# Patient Record
Sex: Male | Born: 1990 | Race: White | Hispanic: No | Marital: Married | Smoking: Current every day smoker
Health system: Southern US, Community
[De-identification: ages and names within clinical notes are randomized; demographics above are authoritative.]

## PROBLEM LIST (undated history)

## (undated) DIAGNOSIS — F329 Major depressive disorder, single episode, unspecified: Secondary | ICD-10-CM

## (undated) DIAGNOSIS — M1990 Unspecified osteoarthritis, unspecified site: Secondary | ICD-10-CM

## (undated) DIAGNOSIS — R7989 Other specified abnormal findings of blood chemistry: Secondary | ICD-10-CM

## (undated) DIAGNOSIS — F32A Depression, unspecified: Secondary | ICD-10-CM

## (undated) HISTORY — PX: OTHER SURGICAL HISTORY: SHX169

## (undated) HISTORY — PX: HYDROCELE EXCISION / REPAIR: SUR1145

---
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
## 2011-04-18 ENCOUNTER — Emergency Department: Payer: Self-pay | Admitting: Emergency Medicine

## 2011-04-18 LAB — CBC
HCT: 47.3 % (ref 40.0–52.0)
MCH: 31.1 pg (ref 26.0–34.0)
MCV: 89 fL (ref 80–100)
Platelet: 297 10*3/uL (ref 150–440)
RBC: 5.32 10*6/uL (ref 4.40–5.90)
WBC: 7.9 10*3/uL (ref 3.8–10.6)

## 2011-04-18 LAB — COMPREHENSIVE METABOLIC PANEL
Alkaline Phosphatase: 154 U/L (ref 98–317)
Anion Gap: 12 (ref 7–16)
BUN: 8 mg/dL (ref 7–18)
Bilirubin,Total: 1.4 mg/dL — ABNORMAL HIGH (ref 0.2–1.0)
Chloride: 106 mmol/L (ref 98–107)
Co2: 26 mmol/L (ref 21–32)
Creatinine: 0.69 mg/dL (ref 0.60–1.30)
EGFR (African American): >60
Glucose: 76 mg/dL (ref 65–99)
SGOT(AST): 29 U/L (ref 10–41)
SGPT (ALT): 28 U/L
Sodium: 144 mmol/L (ref 136–145)
Total Protein: 8 g/dL (ref 6.4–8.6)

## 2011-04-18 LAB — URINALYSIS, COMPLETE
Bacteria: NONE SEEN
Bilirubin,UR: NEGATIVE
Blood: NEGATIVE
Ph: 7 (ref 4.5–8.0)
RBC,UR: <1 /HPF (ref 0–5)
Squamous Epithelial: NONE SEEN

## 2011-04-18 LAB — DRUG SCREEN, URINE
Amphetamines, Ur Screen: POSITIVE (ref ?–1000)
Barbiturates, Ur Screen: NEGATIVE (ref ?–200)
Benzodiazepine, Ur Scrn: NEGATIVE (ref ?–200)
Cannabinoid 50 Ng, Ur ~~LOC~~: NEGATIVE (ref ?–50)
MDMA (Ecstasy)Ur Screen: NEGATIVE (ref ?–500)
Phencyclidine (PCP) Ur S: NEGATIVE (ref ?–25)

## 2011-07-10 ENCOUNTER — Emergency Department: Payer: Self-pay | Admitting: Emergency Medicine

## 2011-07-25 ENCOUNTER — Ambulatory Visit: Payer: Self-pay | Admitting: Rheumatology

## 2012-07-19 ENCOUNTER — Emergency Department: Payer: Self-pay | Admitting: Emergency Medicine

## 2012-07-19 LAB — CBC
HCT: 45.5 % (ref 40.0–52.0)
HGB: 16 g/dL (ref 13.0–18.0)
MCH: 30.6 pg (ref 26.0–34.0)
Platelet: 266 10*3/uL (ref 150–440)
RBC: 5.24 10*6/uL (ref 4.40–5.90)
WBC: 11.5 10*3/uL — ABNORMAL HIGH (ref 3.8–10.6)

## 2012-07-19 LAB — COMPREHENSIVE METABOLIC PANEL
BUN: 9 mg/dL (ref 7–18)
Bilirubin,Total: 1.2 mg/dL — ABNORMAL HIGH (ref 0.2–1.0)
Co2: 27 mmol/L (ref 21–32)
Glucose: 77 mg/dL (ref 65–99)
Osmolality: 275 (ref 275–301)
Potassium: 3.3 mmol/L — ABNORMAL LOW (ref 3.5–5.1)
SGOT(AST): 23 U/L (ref 15–37)
SGPT (ALT): 26 U/L (ref 12–78)
Sodium: 139 mmol/L (ref 136–145)
Total Protein: 7.5 g/dL (ref 6.4–8.2)

## 2012-08-18 ENCOUNTER — Emergency Department: Payer: Self-pay | Admitting: Internal Medicine

## 2014-11-03 ENCOUNTER — Emergency Department
Admission: EM | Admit: 2014-11-03 | Discharge: 2014-11-03 | Disposition: A | Discharge status: Home / Self Care | Payer: BLUE CROSS/BLUE SHIELD | Attending: Emergency Medicine | Admitting: Emergency Medicine

## 2014-11-03 ENCOUNTER — Encounter: Payer: Self-pay | Admitting: Emergency Medicine

## 2014-11-03 ENCOUNTER — Emergency Department: Payer: BLUE CROSS/BLUE SHIELD

## 2014-11-03 DIAGNOSIS — N508 Other specified disorders of male genital organs: Secondary | ICD-10-CM | POA: Insufficient documentation

## 2014-11-03 DIAGNOSIS — N50811 Right testicular pain: Secondary | ICD-10-CM

## 2014-11-03 DIAGNOSIS — N50819 Testicular pain, unspecified: Secondary | ICD-10-CM

## 2014-11-03 HISTORY — DX: Depression, unspecified: F32.A

## 2014-11-03 HISTORY — DX: Major depressive disorder, single episode, unspecified: F32.9

## 2014-11-03 HISTORY — DX: Other specified abnormal findings of blood chemistry: R79.89

## 2014-11-03 LAB — URINALYSIS COMPLETE WITH MICROSCOPIC (ARMC ONLY)
Bacteria, UA: NONE SEEN
Bilirubin Urine: NEGATIVE
Glucose, UA: NEGATIVE mg/dL
KETONES UR: NEGATIVE mg/dL
NITRITE: NEGATIVE
PROTEIN: NEGATIVE mg/dL
Specific Gravity, Urine: 1.021 (ref 1.005–1.030)
pH: 5 (ref 5.0–8.0)

## 2014-11-03 NOTE — ED Notes (Signed)
Patient to ER for c/o right sided testicular pain since yesterday. Was recently started on Androgen gel.

## 2014-11-03 NOTE — ED Provider Notes (Signed)
[facility] Emergency Department Provider Note  ____________________________________________  Time seen: Approximately 6:32 PM  I have reviewed the triage vital signs and the nursing notes.   HISTORY  Chief Complaint Testicle Pain    HPI Dakota Oliver is a 24 y.o. male patient reports his doctor had started him on some and her gel. He is having some burning pain in his right testicle. The shadow test was done here and was normal. This just started. Patient denies any discharge dysuria or any other complaints. Nothing seems to make the pain better or worse.   Past Medical History  Diagnosis Date  . Low testosterone   . Depression     There are no active problems to display for this patient.   Past Surgical History  Procedure Laterality Date  . Hydrocele excision / repair    . Shrapnel removal from chest      No current outpatient prescriptions on file.  Allergies Prochlorperazine  No family history on file.  Social History History  Substance Use Topics  . Smoking status: Never Smoker   . Smokeless tobacco: Not on file  . Alcohol Use: No    Review of Systems Constitutional: No fever/chills Eyes: No visual changes. ENT: No sore throat. Cardiovascular: Denies chest pain. Respiratory: Denies shortness of breath. Gastrointestinal: No abdominal pain.  No nausea, no vomiting.  No diarrhea.  No constipation. Genitourinary: Negative for dysuria. Musculoskeletal: Negative for back pain. Skin: Negative for rash. Neurological: Negative for headaches, focal weakness or numbness.  10-point ROS otherwise negative.  ____________________________________________   PHYSICAL EXAM:  VITAL SIGNS: ED Triage Vitals  Enc Vitals Group     BP 11/03/14 1529 120/58 mmHg     Pulse Rate 11/03/14 1529 86     Resp 11/03/14 1529 18     Temp 11/03/14 1529 98.4 F (36.9 C)     Temp Source 11/03/14 1529 Oral     SpO2 11/03/14 1529 97 %     Weight  11/03/14 1529 220 lb (99.791 kg)     Height 11/03/14 1529  (1.676 m)     Head Cir --      Peak Flow --      Pain Score 11/03/14 1530 4     Pain Loc --      Pain Edu? --      Excl. in GC? --     Constitutional: Alert and oriented. Well appearing and in no acute distress. Eyes: Conjunctivae are normal. PERRL. EOMI. Head: Atraumatic. Nose: No congestion/rhinnorhea. Mouth/Throat: Mucous membranes are moist.   Genitourinary: Normal circumcised male. Testicles are normal in size nontender to palpation and there are no nodes palpable in the scrotum. Spermatic cord feels normal. There is no hernia that I can find.  Musculoskeletal: No lower extremity tenderness nor edema.  No joint effusions. Neurologic:  Normal speech and language. No gross focal neurologic deficits are appreciated.  Skin:  Skin is warm, dry and intact. No rash noted. Psychiatric: Mood and affect are normal. Speech and behavior are normal.  ____________________________________________   LABS (all labs ordered are listed, but only abnormal results are displayed)  Labs Reviewed  URINALYSIS COMPLETEWITH MICROSCOPIC ([facility] ONLY)   ____________________________________________  EKG   ____________________________________________  RADIOLOGY  Ultrasound is reported as no torsion ____________________________________________   PROCEDURES    ____________________________________________   INITIAL IMPRESSION / ASSESSMENT AND PLAN / ED COURSE  Pertinent labs & imaging results that were available during my care of the patient  were reviewed by me and considered in my medical decision making (see chart for details).  Skills with Dr. Berneice Heinrich a urologist. He recommends stopping the AndroGel. No other treatment. ____________________________________________   FINAL CLINICAL IMPRESSION(S) / ED DIAGNOSES  Final diagnoses:  Pain in right testicle      Arnaldo Natal, MD 11/04/14 0040

## 2014-11-03 NOTE — Discharge Instructions (Signed)
Please follow-up with your doctor or the urologist. Dr. can refer you to the [facility] urologist of his choice. All just on call here tonight Dr. Urban Gibson recommends stopping AndroGel. He feels that will put it into the testicular pain. Please return for worse pain or any other problems. Urine did not show any sign of infection. The ultrasound was normal.

## 2016-05-14 ENCOUNTER — Ambulatory Visit: Payer: Self-pay | Admitting: Urology

## 2016-05-14 ENCOUNTER — Ambulatory Visit (INDEPENDENT_AMBULATORY_CARE_PROVIDER_SITE_OTHER): Payer: BLUE CROSS/BLUE SHIELD | Admitting: Urology

## 2016-05-14 ENCOUNTER — Encounter: Payer: Self-pay | Admitting: Urology

## 2016-05-14 VITALS — BP 123/82 | HR 51 | Ht 66.0 in | Wt 170.0 lb

## 2016-05-14 DIAGNOSIS — N432 Other hydrocele: Secondary | ICD-10-CM | POA: Diagnosis not present

## 2016-05-14 DIAGNOSIS — N50819 Testicular pain, unspecified: Secondary | ICD-10-CM | POA: Diagnosis not present

## 2016-05-14 DIAGNOSIS — N451 Epididymitis: Secondary | ICD-10-CM

## 2016-05-14 DIAGNOSIS — E291 Testicular hypofunction: Secondary | ICD-10-CM | POA: Diagnosis not present

## 2016-05-14 DIAGNOSIS — Z113 Encounter for screening for infections with a predominantly sexual mode of transmission: Secondary | ICD-10-CM | POA: Diagnosis not present

## 2016-05-14 MED ORDER — CEFTRIAXONE SODIUM 1 G IJ SOLR: 1.0000 g | INTRAMUSCULAR | Status: DC

## 2016-05-14 MED ORDER — DOXYCYCLINE HYCLATE 100 MG PO CAPS: 100.0000 mg | ORAL_CAPSULE | Freq: Two times a day (BID) | ORAL | 0 refills | Status: DC

## 2016-05-14 MED ORDER — CEFTRIAXONE SODIUM 1 G IJ SOLR
1.0000 g | Freq: Once | INTRAMUSCULAR | Status: AC
  Administered 2016-05-14: 1 g via INTRAMUSCULAR

## 2016-05-14 NOTE — Progress Notes (Signed)
IM Injection  Patient is present today for an IM Injection for treatment of epidiymis  Drug: Ceftriaxone Dose:1GM Location:Left upper outer buttocks Lot: [id number] Exp:01/29/2018 Patient tolerated well, no complications were noted  Preformed by: Eligha BridegroomSarah Nusrat Encarnacion, CMA

## 2016-05-14 NOTE — Progress Notes (Signed)
05/14/2016 3:12 PM   Dakota Oliver [birth year] [id number]  Referring provider: Sherron Monday, MD [address] [place], [place] [address]  Chief Complaint  Patient presents with  . Testicle Pain    New Patient    HPI: 26 yo M with who presents today for 2 weeks history right testicular pain and swelling. He notes that initially his right testicle and scrotum enlarged dramatically and is slowly improving although remains tender. He describes it as constant, dull, and achy. He has no associated nausea, or vomiting. No abdominal pain.  He notes that he was treated by his primary care physician with antibiotics but his symptoms have completely resolved.  He was given a 10 day course of Levaquin.    He denies any fevers or chills, urgency, frequency, or dysuria.  No recent scrotal imaging. He did have an episode of testicular pain in 2016 at which time he had a fairly unremarkable scrotal ultrasound.  He is exually active, recent history of chlamydia a few months ago, partner treated as treated as well.  At the time of diagnosis, he was having dysuria.  Denies any scrotal trauma.    S/p hernia hydrocele on the left at age 54.    Dakota Oliver reports today that he has been on testosterone injections for the past several years prescribed by his PCP for hypogonadism. He has been using this intermittently as he has difficulty affording this and says he has not been able to give himself injections for the past 3 months. He reports that when he was diagnosed, his testosterone was around 70 per report. His symptoms at that time included at mental health issues, low libido, and fatigue. He does want children in the future.  He does topically smoke marijuana but reports that he had been abstaining from this for quite some time before he was diagnosed with hypogonadism. We do not have any lab work today for review.     PMH: Past Medical History:  Diagnosis Date  . Depression   . Low  testosterone     Surgical History: Past Surgical History:  Procedure Laterality Date  . HYDROCELE EXCISION / REPAIR    . Shrapnel removal from chest      Home Medications:  Allergies as of 05/14/2016      Reactions   Prochlorperazine Other (See Comments)   Dystonic reaction      Medication List       Accurate as of 05/14/16 11:59 PM. Always use your most recent med list.          amphetamine-dextroamphetamine 20 MG 24 hr capsule Commonly known as:  ADDERALL XR Take 20 mg by mouth every morning.   doxycycline 100 MG capsule Commonly known as:  VIBRAMYCIN Take 1 capsule (100 mg total) by mouth every 12 (twelve) hours.   FLUoxetine 20 MG capsule Commonly known as:  PROZAC Take 20 mg by mouth every morning.   testosterone cypionate 200 MG/ML injection Commonly known as:  DEPOTESTOSTERONE CYPIONATE inject 1 milliliter intramuscularly EVERY WEEK       Allergies:  Allergies  Allergen Reactions  . Prochlorperazine Other (See Comments)    Dystonic reaction    Family History: Family History  Problem Relation Age of Onset  . Prostate cancer Neg Hx   . Kidney cancer Neg Hx   . Bladder Cancer Neg Hx     Social History:  reports that he has been smoking E-cigarettes and Cigarettes.  He has never used smokeless  tobacco. He reports that he does not drink alcohol. His drug history is not on file.  ROS: UROLOGY Frequent Urination?: No Hard to postpone urination?: No Burning/pain with urination?: No Get up at night to urinate?: No Leakage of urine?: No Urine stream starts and stops?: No Trouble starting stream?: No Do you have to strain to urinate?: No Blood in urine?: No Urinary tract infection?: No Sexually transmitted disease?: Yes Injury to kidneys or bladder?: No Painful intercourse?: Yes Weak stream?: No Erection problems?: No Penile pain?: No  Gastrointestinal Nausea?: Yes Vomiting?: No Indigestion/heartburn?: No Diarrhea?: Yes Constipation?:  Yes  Constitutional Fever: No Night sweats?: Yes Weight loss?: No Fatigue?: Yes  Skin Skin rash/lesions?: No Itching?: No  Eyes Blurred vision?: No Double vision?: No  Ears/Nose/Throat Sore throat?: No Sinus problems?: Yes  Hematologic/Lymphatic Swollen glands?: Yes Easy bruising?: No  Cardiovascular Leg swelling?: No Chest pain?: No  Respiratory Cough?: Yes Shortness of breath?: No  Endocrine Excessive thirst?: Yes  Musculoskeletal Back pain?: Yes Joint pain?: No  Neurological Headaches?: No Dizziness?: No  Psychologic Depression?: Yes Anxiety?: Yes  Physical Exam: BP 123/82   Pulse (!) 51   Ht 5\' 6"  (1.676 m)   Wt 170 lb (77.1 kg)   BMI 27.44 kg/m   Constitutional:  Alert and oriented, No acute distress. HEENT: [place] AT, moist mucus membranes.  Trachea midline, no masses. Cardiovascular: No clubbing, cyanosis, or edema. Respiratory: Normal respiratory effort, no increased work of breathing. GI: Abdomen is soft, nontender, nondistended, no abdominal masses GU: Right epididymal enlargement and tenderness with small surrounding hydrocele. No overlying skin changes. Left testicle unremarkable. No intratesticular lesions bilaterally.  Normal circumcised phallus with orthotopic meatus, no urethral discharge appreciated.  Normal male hair pattern. Skin: No rashes, bruises or suspicious lesions. Lymph: No cervical or inguinal adenopathy. Neurologic: Grossly intact, no focal deficits, moving all 4 extremities. Psychiatric: Normal mood and affect.  Laboratory Data: Lab Results  Component Value Date   WBC 11.5 (H) 07/19/2012   HGB 16.0 07/19/2012   HCT 45.5 07/19/2012   MCV 87 07/19/2012   PLT 266 07/19/2012    Lab Results  Component Value Date   CREATININE 0.74 07/19/2012    Urinalysis Results for orders placed or performed in visit on 05/14/16  GC/Chlamydia Probe Amp  Result Value Ref Range   Chlamydia trachomatis, NAA Negative Negative    Neisseria gonorrhoeae by PCR Negative Negative  Microscopic Examination  Result Value Ref Range   WBC, UA 0-5 0 - 5 /hpf   RBC, UA 0-2 0 - 2 /hpf   Epithelial Cells (non renal) None seen 0 - 10 /hpf   Mucus, UA Present (A) Not Estab.   Bacteria, UA Few None seen/Few  Urinalysis, Complete  Result Value Ref Range   Specific Gravity, UA 1.020 1.005 - 1.030   pH, UA 6.0 5.0 - 7.5   Color, UA Yellow Yellow   Appearance Ur Clear Clear   Leukocytes, UA Negative Negative   Protein, UA 1+ (A) Negative/Trace   Glucose, UA Negative Negative   Ketones, UA Negative Negative   RBC, UA Trace (A) Negative   Bilirubin, UA Negative Negative   Urobilinogen, Ur 0.2 0.2 - 1.0 mg/dL   Nitrite, UA Negative Negative   Microscopic Examination See below:     Pertinent Imaging: n/a  Assessment & Plan:    1. Right epididymitis Tender right epididymis on exam, per patient improving somewhat but not yet resolved As a described Levaquin, will administer IV  Rocephin and change to oral doxycycline given his history of recent chlamydial infection STA screening as below Supportive care and NSAIDs  Patient advised to return or call if he fails to improve within the next week - Urinalysis, Complete - Trichomonas vaginalis, RNA - GC/Chlamydia Probe Amp - Testosterone; Future - FSH/LH; Future - Prolactin; Future - Estradiol; Future - HIV antibody (with reflex) - cefTRIAXone (ROCEPHIN) injection 1 g; Inject 1 g into the muscle once.  2. Other hydrocele Reactive mild right hydrocele, will likely improve with treatment of his epididymitis  3. Routine screening for STI (sexually transmitted infection) Recent history of chlamydia Patient interested in GC/chlamydia and HIV screening No Trichomonas in urine today on microscopy  4. Hypogonadism in male Reported history of hypogonadism intermittently using testosterone therapy This is somewhat alarming given the patient's age and desire for biological  children in the future for which I expressed my concern Suspect possible alternative underlying etiology for reported hypogonadal state including use of marijuana, etc. I offered to manage the patient's hypogonadism and start fresh with a new workup I have encouraged him to continue to abstain from testosterone injections for at least another month at which time he will not taken any exotic this medication for at least 4 months we'll plan for a.m. testosterone, FSH, LH, prolactin, TSH, CBC, estridiol    Return in about 4 weeks (around 06/11/2016) for labs only (AM) then follow up with me 6 weeks.  Vanna ScotlandAshley Juandaniel Manfredo, MD  [facility] [address], [address] [place], [place] [address] [phone]   I spent 45 min with this patient of which greater than 50% was spent in counseling and coordination of care with the patient.

## 2016-05-15 LAB — URINALYSIS, COMPLETE
Bilirubin, UA: NEGATIVE
Glucose, UA: NEGATIVE
Ketones, UA: NEGATIVE
Leukocytes, UA: NEGATIVE
Nitrite, UA: NEGATIVE
Specific Gravity, UA: 1.02 (ref 1.005–1.030)
UUROB: 0.2 mg/dL (ref 0.2–1.0)
pH, UA: 6 (ref 5.0–7.5)

## 2016-05-15 LAB — MICROSCOPIC EXAMINATION: Epithelial Cells (non renal): NONE SEEN /hpf (ref 0–10)

## 2016-05-18 LAB — GC/CHLAMYDIA PROBE AMP
CHLAMYDIA, DNA PROBE: NEGATIVE
Neisseria gonorrhoeae by PCR: NEGATIVE

## 2016-05-19 ENCOUNTER — Telehealth: Payer: Self-pay

## 2016-05-19 LAB — TRICHOMONAS VAGINALIS, PROBE AMP: TRICH VAG BY NAA: NEGATIVE

## 2016-05-19 NOTE — Telephone Encounter (Signed)
Pt returned Chelsea's call and I read message from Dr. Apolinar JunesBrandon.

## 2016-05-19 NOTE — Telephone Encounter (Signed)
LMOM

## 2016-05-19 NOTE — Telephone Encounter (Signed)
-----   Message from Vanna ScotlandAshley Brandon, MD sent at 05/18/2016  1:56 PM EST ----- GC/ chlamydia negative.  We will get HIV with blood labs in 1 month.    Vanna ScotlandAshley Brandon, MD

## 2016-05-22 ENCOUNTER — Telehealth: Payer: Self-pay | Admitting: Urology

## 2016-05-22 NOTE — Telephone Encounter (Signed)
Would you call the patient and get clarification on his symptoms, is he having fevers and/or chills, nausea and/or vomiting, swelling of the testicle?

## 2016-05-22 NOTE — Telephone Encounter (Signed)
Pt saw Dr. Apolinar JunesBrandon last week. Please advise.

## 2016-05-22 NOTE — Telephone Encounter (Signed)
Pt called office stating he was seen about a week ago, was given medication, has finished all medication, pt states not getting any better, it's getting worse with a lot of pain. Please advise.

## 2016-05-22 NOTE — Telephone Encounter (Signed)
Spoke with pt in reference to s/s. Pt denied n/v,f/c. Pt did state that his right testicle is swollen.

## 2016-05-23 NOTE — Telephone Encounter (Signed)
LMOM

## 2016-05-23 NOTE — Telephone Encounter (Signed)
I looks like this has been a chronic issue as he had complaints of right testicular pain in 2016.  Scrotal ultrasound noted hydroceles and bilateral epididymal cysts.  I suggest he have an office visit, so he can be reassess.

## 2016-05-23 NOTE — Telephone Encounter (Signed)
Spoke with pt in reference to testicular pain and needing an OV. Pt voiced understanding. Pt was transferred to the front to make f/u appt.

## 2016-05-26 NOTE — Progress Notes (Deleted)
05/27/2016 9:54 PM   Cristal Deerhristopher Bettey CostaM Lepkowski November 21, 1990 [id number]  Referring provider: Sherron MondayS Ahmed Tejan-Sie, MD [address] [place], [place] [address]  No chief complaint on file.   HPI: 26 yo WM with a right testicular pain and swelling that has not improved with IM Rocephin and doxycycline.  Background history 26 yo M with who presents today for 2 weeks history right testicular pain and swelling. He notes that initially his right testicle and scrotum enlarged dramatically and is slowly improving although remains tender. He describes it as constant, dull, and achy. He has no associated nausea, or vomiting. No abdominal pain.  He notes that he was treated by his primary care physician with antibiotics but his symptoms have completely resolved.  He was given a 10 day course of Levaquin.  He denies any fevers or chills, urgency, frequency, or dysuria.  No recent scrotal imaging. He did have an episode of testicular pain in 2016 at which time he had a fairly unremarkable scrotal ultrasound.  He is exually active, recent history of chlamydia a few months ago, partner treated as treated as well.  At the time of diagnosis, he was having dysuria.  Denies any scrotal trauma.  S/p hernia hydrocele on the left at age 89.    Trichomonas and GC/chlamydia were negative.    Today, he is ***.     [name] reports today that he has been on testosterone injections for the past several years prescribed by his PCP for hypogonadism. He has been using this intermittently as he has difficulty affording this and says he has not been able to give himself injections for the past 3 months. He reports that when he was diagnosed, his testosterone was around 70 per report. His symptoms at that time included at mental health issues, low libido, and fatigue. He does want children in the future.  He does topically smoke marijuana but reports that he had been abstaining from this for quite some time before he was diagnosed with  hypogonadism. We do not have any lab work today for review.  He will be returning at a later date for lab work after he has abstained from testosterone therapy for one month.     PMH: Past Medical History:  Diagnosis Date  . Depression   . Low testosterone     Surgical History: Past Surgical History:  Procedure Laterality Date  . HYDROCELE EXCISION / REPAIR    . Shrapnel removal from chest      Home Medications:  Allergies as of 05/27/2016      Reactions   Prochlorperazine Other (See Comments)   Dystonic reaction      Medication List       Accurate as of 05/26/16  9:54 PM. Always use your most recent med list.          amphetamine-dextroamphetamine 20 MG 24 hr capsule Commonly known as:  ADDERALL XR Take 20 mg by mouth every morning.   doxycycline 100 MG capsule Commonly known as:  VIBRAMYCIN Take 1 capsule (100 mg total) by mouth every 12 (twelve) hours.   FLUoxetine 20 MG capsule Commonly known as:  PROZAC Take 20 mg by mouth every morning.   testosterone cypionate 200 MG/ML injection Commonly known as:  DEPOTESTOSTERONE CYPIONATE inject 1 milliliter intramuscularly EVERY WEEK       Allergies:  Allergies  Allergen Reactions  . Prochlorperazine Other (See Comments)    Dystonic reaction    Family History: Family History  Problem  Relation Age of Onset  . Prostate cancer Neg Hx   . Kidney cancer Neg Hx   . Bladder Cancer Neg Hx     Social History:  reports that he has been smoking E-cigarettes and Cigarettes.  He has never used smokeless tobacco. He reports that he does not drink alcohol. His drug history is not on file.  ROS:                                        Physical Exam: There were no vitals taken for this visit.  Constitutional:  Alert and oriented, No acute distress. HEENT: [place] AT, moist mucus membranes.  Trachea midline, no masses. Cardiovascular: No clubbing, cyanosis, or edema. Respiratory: Normal respiratory  effort, no increased work of breathing. GI: Abdomen is soft, nontender, nondistended, no abdominal masses GU: Right epididymal enlargement and tenderness with small surrounding hydrocele. No overlying skin changes. Left testicle unremarkable. No intratesticular lesions bilaterally.  Normal circumcised phallus with orthotopic meatus, no urethral discharge appreciated.  Normal male hair pattern. Skin: No rashes, bruises or suspicious lesions. Lymph: No cervical or inguinal adenopathy. Neurologic: Grossly intact, no focal deficits, moving all 4 extremities. Psychiatric: Normal mood and affect.  Laboratory Data: Lab Results  Component Value Date   WBC 11.5 (H) 07/19/2012   HGB 16.0 07/19/2012   HCT 45.5 07/19/2012   MCV 87 07/19/2012   PLT 266 07/19/2012    Lab Results  Component Value Date   CREATININE 0.74 07/19/2012    Urinalysis ***  Pertinent Imaging: n/a  Assessment & Plan:    1. Right epididymitis Tender right epididymis on exam, per patient improving somewhat but not yet resolved As a described Levaquin, will administer IV Rocephin and change to oral doxycycline given his history of recent chlamydial infection STA screening as below Supportive care and NSAIDs  Patient advised to return or call if he fails to improve within the next week - Urinalysis, Complete - Trichomonas vaginalis, RNA - GC/Chlamydia Probe Amp - Testosterone; Future - FSH/LH; Future - Prolactin; Future - Estradiol; Future - HIV antibody (with reflex) - cefTRIAXone (ROCEPHIN) injection 1 g; Inject 1 g into the muscle once.  2. Other hydrocele Reactive mild right hydrocele, will likely improve with treatment of his epididymitis  3. Routine screening for STI (sexually transmitted infection) Recent history of chlamydia GC/chlamydia negative HIV screening when he returns in one month No Trichomonas in urine today on microscopy  4. Hypogonadism in male Reported history of hypogonadism  intermittently using testosterone therapy This is somewhat alarming given the patient's age and desire for biological children in the future for which I expressed my concern Suspect possible alternative underlying etiology for reported hypogonadal state including use of marijuana, etc. I offered to manage the patient's hypogonadism and start fresh with a new workup I have encouraged him to continue to abstain from testosterone injections for at least another month at which time he will not taken any exotic this medication for at least 4 months we'll plan for a.m. testosterone, FSH, LH, prolactin, TSH, CBC, estridiol    No Follow-up on file.  Michiel Cowboy, PA-C  [facility] [address] [place], [place] [address] [phone]   I spent 45 min with this patient of which greater than 50% was spent in counseling and coordination of care with the patient.

## 2016-05-27 ENCOUNTER — Encounter: Payer: Self-pay | Admitting: Urology

## 2016-05-27 ENCOUNTER — Ambulatory Visit: Payer: BLUE CROSS/BLUE SHIELD | Admitting: Urology

## 2016-06-11 ENCOUNTER — Other Ambulatory Visit: Payer: BLUE CROSS/BLUE SHIELD

## 2016-06-11 DIAGNOSIS — N451 Epididymitis: Secondary | ICD-10-CM

## 2016-06-12 ENCOUNTER — Encounter: Payer: Self-pay | Admitting: Urology

## 2016-06-12 ENCOUNTER — Ambulatory Visit (INDEPENDENT_AMBULATORY_CARE_PROVIDER_SITE_OTHER): Payer: BLUE CROSS/BLUE SHIELD | Admitting: Urology

## 2016-06-12 VITALS — BP 111/66 | HR 83 | Ht 66.0 in | Wt 171.8 lb

## 2016-06-12 DIAGNOSIS — Z113 Encounter for screening for infections with a predominantly sexual mode of transmission: Secondary | ICD-10-CM | POA: Diagnosis not present

## 2016-06-12 DIAGNOSIS — N451 Epididymitis: Secondary | ICD-10-CM

## 2016-06-12 DIAGNOSIS — N432 Other hydrocele: Secondary | ICD-10-CM

## 2016-06-12 DIAGNOSIS — E291 Testicular hypofunction: Secondary | ICD-10-CM | POA: Diagnosis not present

## 2016-06-12 LAB — FSH/LH
FSH: 1.5 m[IU]/mL (ref 1.5–12.4)
LH: 2.7 m[IU]/mL (ref 1.7–8.6)

## 2016-06-12 LAB — ESTRADIOL: Estradiol: 30 pg/mL (ref 7.6–42.6)

## 2016-06-12 LAB — TESTOSTERONE: TESTOSTERONE: 502 ng/dL (ref 264–916)

## 2016-06-12 LAB — PROLACTIN: Prolactin: 8.5 ng/mL (ref 4.0–15.2)

## 2016-06-12 MED ORDER — IBUPROFEN 800 MG PO TABS: 800.0000 mg | ORAL_TABLET | Freq: Three times a day (TID) | ORAL | 0 refills | Status: DC | PRN

## 2016-06-12 NOTE — Progress Notes (Signed)
06/12/2016 9:28 AM   Dakota Oliver 1990-10-27 [id number]  Referring provider: Sherron MondayS Ahmed Tejan-Sie, MD [address] [place], [place] [id number]  Chief Complaint  Patient presents with  . Testicle Pain    right    HPI: 26 yo M with who presents today for chronic weeks history right testicular pain and swelling.   He has been treated now with 2 rounds of antibiotics, initially Levaquin for 10 days by his PCP followed by doxycycline x 2 weeks with single dose ceftriaxone approximately 1 month ago.  He reports that he did have some mild improvement in his overall tenderness and swelling but this quickly recurred even prior to completing course of antibiotics. He has not been consistently wearing his scrotal support but his pain doesn't improve with wearing supportive undergarments.  Specifically today, he mentions that he has pain with bending, will lifting, and moving. His pain radiates from his scrotum up through his inguinal canal and around to his umbilicus and lower abdomen.  He denies any fevers or chills, urgency, frequency, or dysuria.  STI testing including gonorrhea, chlamydia, and remote is negative.  No recent scrotal imaging. He did have an episode of testicular pain in 2016 at which time he had a fairly unremarkable scrotal ultrasound.  Denies any scrotal trauma.  S/p hernia hydrocele on the left at age 89.    Mr. Dakota Oliver has been on testosterone injections for the past several years prescribed by his PCP for hypogonadism. He has been using this intermittently as he has difficulty affording this and says he has not been able to give himself injections for the past 3 months. He reports that when he was diagnosed, his testosterone was around 70 per report. His symptoms at that time included at mental health issues, low libido, and fatigue. He does want children in the future.  He does topically smoke marijuana but reports that he had been abstaining from this for quite some  time before he was diagnosed with hypogonadism.  Lab work today is all normal with normal testosterone in the 500s.   PMH: Past Medical History:  Diagnosis Date  . Depression   . Low testosterone     Surgical History: Past Surgical History:  Procedure Laterality Date  . HYDROCELE EXCISION / REPAIR    . Shrapnel removal from chest      Home Medications:  Allergies as of 06/12/2016      Reactions   Prochlorperazine Other (See Comments)   Dystonic reaction      Medication List       Accurate as of 06/12/16  9:28 AM. Always use your most recent med list.          amphetamine-dextroamphetamine 20 MG 24 hr capsule Commonly known as:  ADDERALL XR Take 20 mg by mouth every morning.   FLUoxetine 20 MG capsule Commonly known as:  PROZAC Take 20 mg by mouth every morning.   ibuprofen 800 MG tablet Commonly known as:  ADVIL,MOTRIN Take 1 tablet (800 mg total) by mouth every 8 (eight) hours as needed.       Allergies:  Allergies  Allergen Reactions  . Prochlorperazine Other (See Comments)    Dystonic reaction    Family History: Family History  Problem Relation Age of Onset  . Prostate cancer Neg Hx   . Kidney cancer Neg Hx   . Bladder Cancer Neg Hx     Social History:  reports that he has been smoking E-cigarettes and Cigarettes.  He  has never used smokeless tobacco. He reports that he does not drink alcohol. His drug history is not on file.  ROS: UROLOGY Frequent Urination?: No Hard to postpone urination?: No Burning/pain with urination?: No Get up at night to urinate?: No Leakage of urine?: No Urine stream starts and stops?: No Trouble starting stream?: No Do you have to strain to urinate?: No Blood in urine?: No Urinary tract infection?: No Sexually transmitted disease?: No Injury to kidneys or bladder?: No Painful intercourse?: No Weak stream?: No Erection problems?: No Penile pain?: No  Gastrointestinal Nausea?: No Vomiting?:  No Indigestion/heartburn?: No Diarrhea?: No Constipation?: No  Constitutional Fever: No Night sweats?: No Weight loss?: No Fatigue?: No  Skin Skin rash/lesions?: No Itching?: No  Eyes Blurred vision?: No Double vision?: No  Ears/Nose/Throat Sore throat?: No Sinus problems?: No  Hematologic/Lymphatic Swollen glands?: No Easy bruising?: No  Cardiovascular Leg swelling?: No Chest pain?: No  Respiratory Cough?: No Shortness of breath?: No  Endocrine Excessive thirst?: No  Musculoskeletal Back pain?: No Joint pain?: No  Neurological Headaches?: No Dizziness?: No  Psychologic Depression?: No Anxiety?: No  Physical Exam: BP 111/66 (BP Location: Left Arm, Patient Position: Sitting, Cuff Size: Normal)   Pulse 83   Ht 5\' 6"  (1.676 m)   Wt 171 lb 12.8 oz (77.9 kg)   BMI 27.73 kg/m   Constitutional:  Alert and oriented, No acute distress. HEENT: [place] AT, moist mucus membranes.  Trachea midline, no masses. Cardiovascular: No clubbing, cyanosis, or edema. Respiratory: Normal respiratory effort, no increased work of breathing. GI: Abdomen is soft, nontender, nondistended, no abdominal masses.  Bilateral inguinal canals carefully assessed and no evidence of hernia. GU: Mild riight epididymal enlargement and tenderness, staple from previous exam with small surrounding hydrocele. No overlying skin changes. Left testicle unremarkable. No intratesticular lesions bilaterally.  Normal circumcised phallus with orthotopic meatus, no urethral discharge appreciated.  Normal male hair pattern. Skin: No rashes, bruises or suspicious lesions. Lymph: No cervical or inguinal adenopathy. Neurologic: Grossly intact, no focal deficits, moving all 4 extremities. Psychiatric: Normal mood and affect.  Laboratory Data: Lab Results  Component Value Date   WBC 11.5 (H) 07/19/2012   HGB 16.0 07/19/2012   HCT 45.5 07/19/2012   MCV 87 07/19/2012   PLT 266 07/19/2012    Lab Results   Component Value Date   CREATININE 0.74 07/19/2012   Results for orders placed or performed in visit on 06/11/16  Testosterone  Result Value Ref Range   Testosterone 502 264 - 916 ng/dL  FSH/LH  Result Value Ref Range   LH 2.7 1.7 - 8.6 mIU/mL   FSH 1.5 1.5 - 12.4 mIU/mL  Prolactin  Result Value Ref Range   Prolactin 8.5 4.0 - 15.2 ng/mL  Estradiol  Result Value Ref Range   Estradiol 30.0 7.6 - 42.6 pg/mL    Pertinent Imaging: n/a  Assessment & Plan:    1. Right epididymitis Supportive care with tight undergarments, importance of this was recommended Note for limited activity at work for the next 2 weeks with no heavy lifting supplied Recommend course of NSAIDs, 800 mg of ibuprofen 3 times a day 7 days, GI precautions reviewed We'll obtain scrotal ultrasound to rule out any underlying pathology- will call with results  2. Other hydrocele Reactive mild right hydrocele, will likely improve with resolution of his epididymitis  3. Routine screening for STI (sexually transmitted infection) Chlamydia, gonorrhea, intradermal and is negative Requests HIV testing which was inadvertently not drawn at  last lab visit, advised to return for this blood draw if interested  4. Hypogonadism in male Repeat blood work unremarkable, no evidence of hypogonadism Given his age, history of chronic testicular pain, and absence of signs or symptoms of hypogonadism, I would recommend abstaining from testosterone therapy Previous hypogonadal state may be related to substance abuse or medication interaction   Will call with results  Vanna Scotland, MD  [facility] [address] [place], [place] [address] [phone]

## 2016-06-24 ENCOUNTER — Ambulatory Visit: Payer: BLUE CROSS/BLUE SHIELD

## 2016-06-24 ENCOUNTER — Ambulatory Visit
Admission: RE | Admit: 2016-06-24 | Discharge: 2016-06-24 | Disposition: A | Discharge status: Home / Self Care | Payer: BLUE CROSS/BLUE SHIELD | Source: Ambulatory Visit | Attending: Urology | Admitting: Urology

## 2016-06-24 DIAGNOSIS — N451 Epididymitis: Secondary | ICD-10-CM | POA: Diagnosis not present

## 2016-06-24 DIAGNOSIS — N433 Hydrocele, unspecified: Secondary | ICD-10-CM | POA: Insufficient documentation

## 2016-06-24 DIAGNOSIS — N503 Cyst of epididymis: Secondary | ICD-10-CM | POA: Diagnosis not present

## 2016-06-25 ENCOUNTER — Ambulatory Visit: Payer: BLUE CROSS/BLUE SHIELD | Admitting: Urology

## 2016-07-10 ENCOUNTER — Telehealth: Payer: Self-pay | Admitting: Urology

## 2016-07-10 NOTE — Telephone Encounter (Signed)
Call patient to review the scrotal ultrasound findings. No significant scrotal pathology or any clear source of his scrotal pain. He'll follow up as needed.  Vanna Scotland, MD

## 2018-03-18 ENCOUNTER — Emergency Department
Admission: EM | Admit: 2018-03-18 | Discharge: 2018-03-18 | Disposition: A | Discharge status: Home / Self Care | Payer: Self-pay | Attending: Emergency Medicine | Admitting: Emergency Medicine

## 2018-03-18 ENCOUNTER — Other Ambulatory Visit: Payer: Self-pay

## 2018-03-18 ENCOUNTER — Emergency Department: Payer: Self-pay

## 2018-03-18 ENCOUNTER — Encounter: Payer: Self-pay | Admitting: Emergency Medicine

## 2018-03-18 DIAGNOSIS — F17290 Nicotine dependence, other tobacco product, uncomplicated: Secondary | ICD-10-CM | POA: Insufficient documentation

## 2018-03-18 DIAGNOSIS — M25572 Pain in left ankle and joints of left foot: Secondary | ICD-10-CM | POA: Insufficient documentation

## 2018-03-18 DIAGNOSIS — F17210 Nicotine dependence, cigarettes, uncomplicated: Secondary | ICD-10-CM | POA: Insufficient documentation

## 2018-03-18 DIAGNOSIS — Z79899 Other long term (current) drug therapy: Secondary | ICD-10-CM | POA: Insufficient documentation

## 2018-03-18 MED ORDER — NAPROXEN 500 MG PO TABS: 500.0000 mg | ORAL_TABLET | Freq: Two times a day (BID) | ORAL | Status: DC

## 2018-03-18 NOTE — Discharge Instructions (Addendum)
Urine complaints is secondary to arthritis.  Advised follow-up with orthopedic for definitive evaluation and treatment.  Advised over-the-counter Aleve when ankle pain flares up.

## 2018-03-18 NOTE — ED Triage Notes (Signed)
Presents with pain too left ankle  States pain has been intermittent for 2 years  Pain increases with standing and with ROM  No deformity noted  Good pulses

## 2018-03-18 NOTE — ED Provider Notes (Signed)
[facility] Emergency Department Provider Note   ____________________________________________   First MD Initiated Contact with Patient 03/18/18 [phone]     (approximate)  I have reviewed the triage vital signs and the nursing notes.   HISTORY  Chief Complaint Ankle Pain    HPI Dakota Oliver is a 27 y.o. male patient presents with left ankle pain.  Patient state complaint intermittent for the last 2 years.  Patient had pain increases with prolonged standing.  No other provocative incident for complaint.  Patient rates pain as a 5/10.  Patient described pain is "achy".  No palliative measures for complaint.  Past Medical History:  Diagnosis Date  . Depression   . Low testosterone     There are no active problems to display for this patient.   Past Surgical History:  Procedure Laterality Date  . HYDROCELE EXCISION / REPAIR    . Shrapnel removal from chest      Prior to Admission medications   Medication Sig Start Date End Date Taking? Authorizing Provider  escitalopram (LEXAPRO) 20 MG tablet Take 20 mg by mouth daily.   Yes [provider]  hydrOXYzine (ATARAX/VISTARIL) 25 MG tablet Take 25 mg by mouth 3 (three) times daily as needed.   Yes [provider]  amphetamine-dextroamphetamine (ADDERALL XR) 20 MG 24 hr capsule Take 20 mg by mouth every morning. 04/18/16   [provider]  FLUoxetine (PROZAC) 20 MG capsule Take 20 mg by mouth every morning. 05/08/16   [provider]  ibuprofen (ADVIL,MOTRIN) 800 MG tablet Take 1 tablet (800 mg total) by mouth every 8 (eight) hours as needed. 06/12/16   Vanna ScotlandBrandon, Ashley, MD  naproxen (NAPROSYN) 500 MG tablet Take 1 tablet (500 mg total) by mouth 2 (two) times daily with a meal. 03/18/18   Joni ReiningSmith, Amani Marseille K, PA-C    Allergies Prochlorperazine  Family History  Problem Relation Age of Onset  . Prostate cancer Neg Hx   . Kidney cancer Neg Hx   . Bladder Cancer Neg Hx       Social History Social History   Tobacco Use  . Smoking status: Current Every Day Smoker    Types: E-cigarettes, Cigarettes  . Smokeless tobacco: Never Used  Substance Use Topics  . Alcohol use: No  . Drug use: Not on file    Review of Systems Constitutional: No fever/chills Eyes: No visual changes. ENT: No sore throat. Cardiovascular: Denies chest pain. Respiratory: Denies shortness of breath. Gastrointestinal: No abdominal pain.  No nausea, no vomiting.  No diarrhea.  No constipation. Genitourinary: Negative for dysuria. Musculoskeletal: Left ankle pain. Skin: Negative for rash. Neurological: Negative for headaches, focal weakness or numbness. Psychiatric:Depression Allergic/Immunilogical:Prochlorperazine ____________________________________________   PHYSICAL EXAM:  VITAL SIGNS: ED Triage Vitals  Enc Vitals Group     BP      Pulse      Resp      Temp      Temp src      SpO2      Weight      Height      Head Circumference      Peak Flow      Pain Score      Pain Loc      Pain Edu?      Excl. in [place]?    Constitutional: Alert and oriented. Well appearing and in no acute distress. Neck: No stridor.  Hematological/Lymphatic/Immunilogical: No cervical lymphadenopathy. Cardiovascular: Normal rate, regular rhythm. Grossly normal heart  sounds.  Good peripheral circulation. Respiratory: Normal respiratory effort.  No retractions. Lungs CTAB. Musculoskeletal: No lower extremity tenderness nor edema.  No joint effusions. Neurologic:  Normal speech and language. No gross focal neurologic deficits are appreciated. No gait instability. Skin:  Skin is warm, dry and intact. No rash noted. Psychiatric: Mood and affect are normal. Speech and behavior are normal.  ____________________________________________   LABS (all labs ordered are listed, but only abnormal results are displayed)  Labs Reviewed - No data to  display ____________________________________________  EKG   ____________________________________________  RADIOLOGY  ED MD interpretation:    Official radiology report(s): Dg Ankle Complete Left  Result Date: 03/18/2018 CLINICAL DATA:  27 year old male with left ankle pain intermittently for the past 2 years EXAM: LEFT ANKLE COMPLETE - 3+ VIEW COMPARISON:  None. FINDINGS: No evidence of acute fracture or joint effusion. Bony prominence arises dorsally from the talar neck on the lateral view in the region of the talar ridge. Additionally, there is mild degenerative change at the talonavicular joint. No definite bony coalition visualized. Normal bony mineralization without lytic or blastic osseous lesion. IMPRESSION: Early degenerative change at the talonavicular joint and a exostosis/spur arising from the dorsal aspect of the talar neck in the region of the talar ridge (joint capsule insertion). While nonspecific, these findings are somewhat advanced for the patient's age and suggest increased or abnormal stress forces on the ankle joint. One consideration would be an underlying tarsal coalition. If further imaging is clinically warranted, consider MRI of the ankle. Electronically Signed   By: Malachy MoanHeath  McCullough M.D.   On: 03/18/2018 10:32    ____________________________________________   PROCEDURES  Procedure(s) performed: None  Procedures  Critical Care performed: No  ____________________________________________   INITIAL IMPRESSION / ASSESSMENT AND PLAN / ED COURSE  As part of my medical decision making, I reviewed the following data within the electronic MEDICAL RECORD NUMBER    Left ankle pain secondary to arthritic changes.  Discussed x-ray findings with patient.  Advised patient follow orthopedic for definitive evaluation and treatment.      ____________________________________________   FINAL CLINICAL IMPRESSION(S) / ED DIAGNOSES  Final diagnoses:  Left ankle pain,  unspecified chronicity     ED Discharge Orders         Ordered    naproxen (NAPROSYN) 500 MG tablet  2 times daily with meals     03/18/18 1041           Note:  This document was prepared using Dragon voice recognition software and may include unintentional dictation errors.    Joni ReiningSmith, Dimple Bastyr K, PA-C 03/18/18 1047    Jene EveryKinner, Robert, MD 03/18/18 [phone]

## 2018-12-09 ENCOUNTER — Other Ambulatory Visit: Payer: Self-pay

## 2018-12-09 ENCOUNTER — Encounter: Payer: Self-pay | Admitting: Emergency Medicine

## 2018-12-09 DIAGNOSIS — Z5321 Procedure and treatment not carried out due to patient leaving prior to being seen by health care provider: Secondary | ICD-10-CM | POA: Insufficient documentation

## 2018-12-09 DIAGNOSIS — T7840XA Allergy, unspecified, initial encounter: Secondary | ICD-10-CM | POA: Diagnosis present

## 2018-12-09 NOTE — ED Triage Notes (Signed)
Pt in via POV, reports allergic reaction to unknown source, complaints of lip swelling, and "feeling like I have a lump in my throat."  Hives noted to bilateral upper and lower extremities.  Pt reports taking 75mg  benadryl at home prior to arrival.  Able to speak in clear, full sentences, no respiratory distress noted at this time.

## 2018-12-10 ENCOUNTER — Emergency Department
Admission: EM | Admit: 2018-12-10 | Discharge: 2018-12-10 | Disposition: A | Discharge status: Home / Self Care | Payer: Medicaid Other | Attending: Emergency Medicine | Admitting: Emergency Medicine

## 2018-12-10 MED ORDER — FAMOTIDINE 20 MG PO TABS
40.0000 mg | ORAL_TABLET | Freq: Once | ORAL | Status: AC
  Administered 2018-12-10: 40 mg via ORAL
  Filled 2018-12-10: qty 2

## 2018-12-10 MED ORDER — PREDNISONE 20 MG PO TABS
60.0000 mg | ORAL_TABLET | Freq: Once | ORAL | Status: AC
  Administered 2018-12-10: 60 mg via ORAL
  Filled 2018-12-10: qty 3

## 2018-12-10 NOTE — ED Notes (Signed)
No answer when called several times from lobby & outside 

## 2018-12-10 NOTE — ED Notes (Signed)
Patient presentation discussed with EDP, Archie Balboa; see new orders.

## 2019-02-19 ENCOUNTER — Emergency Department
Admission: EM | Admit: 2019-02-19 | Discharge: 2019-02-19 | Disposition: A | Discharge status: Home / Self Care | Payer: Medicaid Other | Attending: Emergency Medicine | Admitting: Emergency Medicine

## 2019-02-19 ENCOUNTER — Other Ambulatory Visit: Payer: Self-pay

## 2019-02-19 ENCOUNTER — Encounter: Payer: Self-pay | Admitting: Intensive Care

## 2019-02-19 DIAGNOSIS — F17210 Nicotine dependence, cigarettes, uncomplicated: Secondary | ICD-10-CM | POA: Diagnosis not present

## 2019-02-19 DIAGNOSIS — F1020 Alcohol dependence, uncomplicated: Secondary | ICD-10-CM | POA: Diagnosis present

## 2019-02-19 LAB — COMPREHENSIVE METABOLIC PANEL
ALT: 62 U/L — ABNORMAL HIGH (ref 0–44)
AST: 41 U/L (ref 15–41)
Albumin: 5.1 g/dL — ABNORMAL HIGH (ref 3.5–5.0)
Alkaline Phosphatase: 113 U/L (ref 38–126)
Anion gap: 10 (ref 5–15)
BUN: 7 mg/dL (ref 6–20)
CO2: 26 mmol/L (ref 22–32)
Calcium: 9.6 mg/dL (ref 8.9–10.3)
Chloride: 104 mmol/L (ref 98–111)
Creatinine, Ser: 0.86 mg/dL (ref 0.61–1.24)
GFR calc Af Amer: >60 mL/min (ref >60)
GFR calc non Af Amer: >60 mL/min (ref >60)
Glucose, Bld: 109 mg/dL — ABNORMAL HIGH (ref 70–99)
Potassium: 4 mmol/L (ref 3.5–5.1)
Sodium: 140 mmol/L (ref 135–145)
Total Bilirubin: 1.7 mg/dL — ABNORMAL HIGH (ref 0.3–1.2)
Total Protein: 8.5 g/dL — ABNORMAL HIGH (ref 6.5–8.1)

## 2019-02-19 LAB — SALICYLATE LEVEL: Salicylate Lvl: <7 mg/dL (ref 2.8–30.0)

## 2019-02-19 LAB — CBC
HCT: 48.4 % (ref 39.0–52.0)
Hemoglobin: 17 g/dL (ref 13.0–17.0)
MCH: 31.2 pg (ref 26.0–34.0)
MCHC: 35.1 g/dL (ref 30.0–36.0)
MCV: 88.8 fL (ref 80.0–100.0)
Platelets: 297 10*3/uL (ref 150–400)
RBC: 5.45 MIL/uL (ref 4.22–5.81)
RDW: 13 % (ref 11.5–15.5)
WBC: 7.6 10*3/uL (ref 4.0–10.5)
nRBC: 0 % (ref 0.0–0.2)

## 2019-02-19 LAB — URINE DRUG SCREEN, QUALITATIVE (ARMC ONLY)
Amphetamines, Ur Screen: NOT DETECTED
Barbiturates, Ur Screen: NOT DETECTED
Benzodiazepine, Ur Scrn: POSITIVE — AB
Cannabinoid 50 Ng, Ur ~~LOC~~: POSITIVE — AB
Cocaine Metabolite,Ur ~~LOC~~: NOT DETECTED
MDMA (Ecstasy)Ur Screen: NOT DETECTED
Methadone Scn, Ur: NOT DETECTED
Opiate, Ur Screen: NOT DETECTED
Phencyclidine (PCP) Ur S: NOT DETECTED
Tricyclic, Ur Screen: NOT DETECTED

## 2019-02-19 LAB — ETHANOL: Alcohol, Ethyl (B): <10 mg/dL (ref <10)

## 2019-02-19 LAB — ACETAMINOPHEN LEVEL: Acetaminophen (Tylenol), Serum: <10 ug/mL — ABNORMAL LOW (ref 10–30)

## 2019-02-19 MED ORDER — LORAZEPAM 2 MG PO TABS
2.0000 mg | ORAL_TABLET | Freq: Once | ORAL | Status: AC
  Administered 2019-02-19: 17:00:00 2 mg via ORAL
  Filled 2019-02-19: qty 1

## 2019-02-19 MED ORDER — CHLORDIAZEPOXIDE HCL 25 MG PO CAPS: ORAL_CAPSULE | ORAL | 0 refills | Status: DC

## 2019-02-19 NOTE — ED Provider Notes (Signed)
[facility] Emergency Department Provider Note       Time seen: ----------------------------------------- 4:47 PM on 02/19/2019 -----------------------------------------   I have reviewed the triage vital signs and the nursing notes.  HISTORY   Chief Complaint No chief complaint on file.   HPI Dakota Oliver is a 28 y.o. male with a history of depression who presents to the ED for detox.  Patient states his last alcoholic drink was 4 hours ago reports it was a 24 hours of beer.  Normally drinks 6-8 of those a day for the past month.  Before that he states he did not drink very much.  He reports anxiety.  He is having a headache.  Past Medical History:  Diagnosis Date  . Depression   . Low testosterone     There are no active problems to display for this patient.   Past Surgical History:  Procedure Laterality Date  . HYDROCELE EXCISION / REPAIR    . Shrapnel removal from chest      Allergies Prochlorperazine  Social History Social History   Tobacco Use  . Smoking status: Current Every Day Smoker    Packs/day: 0.50    Types: Cigarettes  . Smokeless tobacco: Never Used  Substance Use Topics  . Alcohol use: Yes  . Drug use: Not Currently   Review of Systems Constitutional: Negative for fever. Cardiovascular: Negative for chest pain. Respiratory: Negative for shortness of breath. Gastrointestinal: Negative for abdominal pain, vomiting and diarrhea. Musculoskeletal: Negative for back pain. Skin: Negative for rash. Neurological: Positive for headache Psychiatric: Positive for alcohol use disorder  All systems negative/normal/unremarkable except as stated in the HPI  ____________________________________________   PHYSICAL EXAM:  VITAL SIGNS: ED Triage Vitals  Enc Vitals Group     BP 02/19/19 1547 126/88     Pulse Rate 02/19/19 1547 (!) 103     Resp 02/19/19 1547 16     Temp 02/19/19 1547 98.8 F (37.1 C)     Temp Source  02/19/19 1547 Oral     SpO2 02/19/19 1547 98 %     Weight 02/19/19 1557 210 lb (95.3 kg)     Height 02/19/19 1557 5\' 6"  (1.676 m)     Head Circumference --      Peak Flow --      Pain Score 02/19/19 1557 4     Pain Loc --      Pain Edu? --      Excl. in [place]? --     Constitutional: Alert and oriented. Well appearing and in no distress. Eyes: Conjunctivae are normal. Normal extraocular movements. Cardiovascular: Normal rate, regular rhythm. No murmurs, rubs, or gallops. Respiratory: Normal respiratory effort without tachypnea nor retractions. Breath sounds are clear and equal bilaterally. No wheezes/rales/rhonchi. Gastrointestinal: Soft and nontender. Normal bowel sounds Musculoskeletal: Nontender with normal range of motion in extremities. No lower extremity tenderness nor edema. Neurologic:  Normal speech and language. No gross focal neurologic deficits are appreciated.  Skin:  Skin is warm, dry and intact. No rash noted. Psychiatric: Mood and affect are normal. Speech and behavior are normal.  ___________________________________________  ED COURSE:  As part of my medical decision making, I reviewed the following data within the [place] History obtained from family if available, nursing notes, old chart and ekg, as well as notes from prior ED visits. Patient presented for detox, patient will be provided oral Ativan.   Procedures  Dakota Oliver was evaluated in Emergency Department  on 02/19/2019 for the symptoms described in the history of present illness. He was evaluated in the context of the global COVID-19 pandemic, which necessitated consideration that the patient might be at risk for infection with the SARS-CoV-2 virus that causes COVID-19. Institutional protocols and algorithms that pertain to the evaluation of patients at risk for COVID-19 are in a state of rapid change based on information released by regulatory bodies including the CDC and federal and  state organizations. These policies and algorithms were followed during the patient's care in the ED.  ____________________________________________   LABS (pertinent positives/negatives)  Labs Reviewed  COMPREHENSIVE METABOLIC PANEL - Abnormal; Notable for the following components:      Result Value   Glucose, Bld 109 (*)    Total Protein 8.5 (*)    Albumin 5.1 (*)    ALT 62 (*)    Total Bilirubin 1.7 (*)    All other components within normal limits  ACETAMINOPHEN LEVEL - Abnormal; Notable for the following components:   Acetaminophen (Tylenol), Serum <10 (*)    All other components within normal limits  ETHANOL  SALICYLATE LEVEL  CBC  URINE DRUG SCREEN, QUALITATIVE ([facility] ONLY)   ____________________________________________   DIFFERENTIAL DIAGNOSIS   Alcohol use disorder, dehydration, alcoholic ketoacidosis, intoxication  FINAL ASSESSMENT AND PLAN  Alcohol use disorder   Plan: The patient had presented for detox. Patient's labs revealed mild abnormalities but no other acute process.  Patient was given oral Ativan and will be discharged home on Librium at his request.  He is advised to return for worsening or worrisome symptoms.   Ulice Dash, MD    Note: This note was generated in part or whole with voice recognition software. Voice recognition is usually quite accurate but there are transcription errors that can and very often do occur. I apologize for any typographical errors that were not detected and corrected.     Emily Filbert, MD 02/19/19 [phone]

## 2019-02-19 NOTE — ED Triage Notes (Addendum)
Patient reports he is here for detox. Patients last alcoholic drink was 4 hours ago and reports it was a 24oz beer. Normally drinks 6-8 of those a day for the past month, before that he says he did not drink much. Reports anxiety

## 2020-01-17 ENCOUNTER — Emergency Department: Payer: Medicaid Other

## 2020-01-17 ENCOUNTER — Emergency Department
Admission: EM | Admit: 2020-01-17 | Discharge: 2020-01-17 | Disposition: A | Discharge status: Home / Self Care | Payer: Medicaid Other | Attending: Emergency Medicine | Admitting: Emergency Medicine

## 2020-01-17 ENCOUNTER — Other Ambulatory Visit: Payer: Self-pay

## 2020-01-17 DIAGNOSIS — R0981 Nasal congestion: Secondary | ICD-10-CM | POA: Diagnosis not present

## 2020-01-17 DIAGNOSIS — R195 Other fecal abnormalities: Secondary | ICD-10-CM

## 2020-01-17 DIAGNOSIS — R197 Diarrhea, unspecified: Secondary | ICD-10-CM | POA: Insufficient documentation

## 2020-01-17 DIAGNOSIS — F17210 Nicotine dependence, cigarettes, uncomplicated: Secondary | ICD-10-CM | POA: Diagnosis not present

## 2020-01-17 DIAGNOSIS — Z20822 Contact with and (suspected) exposure to covid-19: Secondary | ICD-10-CM | POA: Insufficient documentation

## 2020-01-17 DIAGNOSIS — R109 Unspecified abdominal pain: Secondary | ICD-10-CM | POA: Diagnosis not present

## 2020-01-17 LAB — COMPREHENSIVE METABOLIC PANEL
ALT: 18 U/L (ref 0–44)
AST: 18 U/L (ref 15–41)
Albumin: 4.8 g/dL (ref 3.5–5.0)
Alkaline Phosphatase: 92 U/L (ref 38–126)
Anion gap: 11 (ref 5–15)
BUN: 14 mg/dL (ref 6–20)
CO2: 24 mmol/L (ref 22–32)
Calcium: 9.5 mg/dL (ref 8.9–10.3)
Chloride: 102 mmol/L (ref 98–111)
Creatinine, Ser: 0.73 mg/dL (ref 0.61–1.24)
GFR, Estimated: >60 mL/min (ref >60)
Glucose, Bld: 100 mg/dL — ABNORMAL HIGH (ref 70–99)
Potassium: 4.1 mmol/L (ref 3.5–5.1)
Sodium: 137 mmol/L (ref 135–145)
Total Bilirubin: 1 mg/dL (ref 0.3–1.2)
Total Protein: 7.7 g/dL (ref 6.5–8.1)

## 2020-01-17 LAB — URINALYSIS, COMPLETE (UACMP) WITH MICROSCOPIC
Bacteria, UA: NONE SEEN
Bilirubin Urine: NEGATIVE
Glucose, UA: NEGATIVE mg/dL
Hgb urine dipstick: NEGATIVE
Ketones, ur: NEGATIVE mg/dL
Leukocytes,Ua: NEGATIVE
Nitrite: NEGATIVE
Protein, ur: NEGATIVE mg/dL
Specific Gravity, Urine: 1.024 (ref 1.005–1.030)
Squamous Epithelial / HPF: NONE SEEN (ref 0–5)
pH: 6 (ref 5.0–8.0)

## 2020-01-17 LAB — C DIFFICILE QUICK SCREEN W PCR REFLEX
C Diff antigen: NEGATIVE
C Diff interpretation: NOT DETECTED
C Diff toxin: NEGATIVE

## 2020-01-17 LAB — CBC
HCT: 44.9 % (ref 39.0–52.0)
Hemoglobin: 15.7 g/dL (ref 13.0–17.0)
MCH: 31 pg (ref 26.0–34.0)
MCHC: 35 g/dL (ref 30.0–36.0)
MCV: 88.6 fL (ref 80.0–100.0)
Platelets: 303 10*3/uL (ref 150–400)
RBC: 5.07 MIL/uL (ref 4.22–5.81)
RDW: 13 % (ref 11.5–15.5)
WBC: 6.7 10*3/uL (ref 4.0–10.5)
nRBC: 0 % (ref 0.0–0.2)

## 2020-01-17 LAB — RESPIRATORY PANEL BY RT PCR (FLU A&B, COVID)
Influenza A by PCR: NEGATIVE
Influenza B by PCR: NEGATIVE
SARS Coronavirus 2 by RT PCR: NEGATIVE

## 2020-01-17 LAB — LIPASE, BLOOD: Lipase: 35 U/L (ref 11–51)

## 2020-01-17 MED ORDER — DICYCLOMINE HCL 10 MG PO CAPS
10.0000 mg | ORAL_CAPSULE | Freq: Once | ORAL | Status: AC
  Administered 2020-01-17: 10 mg via ORAL
  Filled 2020-01-17: qty 1

## 2020-01-17 MED ORDER — DICYCLOMINE HCL 10 MG PO CAPS: 10.0000 mg | ORAL_CAPSULE | Freq: Three times a day (TID) | ORAL | 0 refills | Status: AC | PRN

## 2020-01-17 MED ORDER — IOHEXOL 300 MG/ML  SOLN
100.0000 mL | Freq: Once | INTRAMUSCULAR | Status: AC | PRN
  Administered 2020-01-17: 100 mL via INTRAVENOUS

## 2020-01-17 MED ORDER — IOHEXOL 9 MG/ML PO SOLN
500.0000 mL | ORAL | Status: AC
  Administered 2020-01-17: 500 mL via ORAL

## 2020-01-17 MED ORDER — SODIUM CHLORIDE 0.9 % IV BOLUS
1000.0000 mL | Freq: Once | INTRAVENOUS | Status: AC
  Administered 2020-01-17: 1000 mL via INTRAVENOUS

## 2020-01-17 MED ORDER — IBUPROFEN 600 MG PO TABS
600.0000 mg | ORAL_TABLET | ORAL | Status: AC
  Administered 2020-01-17: 600 mg via ORAL
  Filled 2020-01-17: qty 1

## 2020-01-17 MED ORDER — ONDANSETRON HCL 4 MG/2ML IJ SOLN
4.0000 mg | Freq: Once | INTRAMUSCULAR | Status: AC
  Administered 2020-01-17: 4 mg via INTRAVENOUS
  Filled 2020-01-17: qty 2

## 2020-01-17 MED ORDER — MORPHINE SULFATE (PF) 4 MG/ML IV SOLN
4.0000 mg | Freq: Once | INTRAVENOUS | Status: AC
  Administered 2020-01-17: 4 mg via INTRAVENOUS
  Filled 2020-01-17: qty 1

## 2020-01-17 NOTE — ED Provider Notes (Signed)
pending stool cultures   Sharyn Creamer, MD 01/17/20 1454

## 2020-01-17 NOTE — ED Triage Notes (Signed)
Pt states for the past 2 weeks every time he eats something within "it goes right through me" sometimes diarrhea sometimes not.

## 2020-01-17 NOTE — ED Provider Notes (Signed)
[facility] Emergency Department Provider Note   ____________________________________________   First MD Initiated Contact with Patient 01/17/20 [phone]     (approximate)  I have reviewed the triage vital signs and the nursing notes.   HISTORY  Chief Complaint Abdominal Pain    HPI Dakota Oliver is a 29 y.o. male here for evaluation of abdominal pain  Patient reports that about a month ago he started developing constipation for 2 weeks, then he developed loose stools and he reports anytime to eat anything a last 2 weeks he will have loose watery bowel movement within about 45 minutes  No travel history no fevers or chills.  He has had a persistent discomfort in the mid abdomen crampy in nature.  Seems to come and go.  Moderate at present  Been told by his doctor previously he may have irritable bowel syndrome, also normally takes prescription for Zofran but ran out yesterday  No chest pain or trouble breathing no fevers.  Mild headache feels like he is "dehydrated"  Son and he both had mild sinus congestion about a week ago, reports that he and his son tested negative for Covid at that time   Past Medical History:  Diagnosis Date  . Depression   . Low testosterone     There are no problems to display for this patient.   Past Surgical History:  Procedure Laterality Date  . HYDROCELE EXCISION / REPAIR    . Shrapnel removal from chest      Prior to Admission medications   Medication Sig Start Date End Date Taking? Authorizing Provider  amphetamine-dextroamphetamine (ADDERALL XR) 20 MG 24 hr capsule Take 20 mg by mouth every morning. 04/18/16   [provider]  chlordiazePOXIDE (LIBRIUM) 25 MG capsule Take 1 tablet by mouth 5 times a day on day 1, then decrease by 1 tablet daily until gone. 02/19/19   Emily Filbert, MD  dicyclomine (BENTYL) 10 MG capsule Take 1 capsule (10 mg total) by mouth 3 (three) times daily as needed for  up to 5 days for spasms. 01/17/20 01/22/20  Sharyn Creamer, MD  escitalopram (LEXAPRO) 20 MG tablet Take 20 mg by mouth daily.    [provider]  FLUoxetine (PROZAC) 20 MG capsule Take 20 mg by mouth every morning. 05/08/16   [provider]  hydrOXYzine (ATARAX/VISTARIL) 25 MG tablet Take 25 mg by mouth 3 (three) times daily as needed.    [provider]  ibuprofen (ADVIL,MOTRIN) 800 MG tablet Take 1 tablet (800 mg total) by mouth every 8 (eight) hours as needed. 06/12/16   Vanna Scotland, MD  naproxen (NAPROSYN) 500 MG tablet Take 1 tablet (500 mg total) by mouth 2 (two) times daily with a meal. 03/18/18   Joni Reining, PA-C    Allergies Prochlorperazine  Family History  Problem Relation Age of Onset  . Prostate cancer Neg Hx   . Kidney cancer Neg Hx   . Bladder Cancer Neg Hx     Social History Social History   Tobacco Use  . Smoking status: Current Every Day Smoker    Packs/day: 0.50    Types: Cigarettes  . Smokeless tobacco: Never Used  Vaping Use  . Vaping Use: Never used  Substance Use Topics  . Alcohol use: Yes  . Drug use: Not Currently    Review of Systems Constitutional: No fever/chills Eyes: No visual changes. ENT: No sore throat. Cardiovascular: Denies chest pain. Respiratory: Denies shortness of breath.  Gastrointestinal: See HPI Genitourinary: Negative for dysuria.  No testicular or groin pain.  1 previous surgery at about age 6 had a left hernia repaired Musculoskeletal: Negative for back pain. Skin: Negative for rash. Neurological: Negative for headaches, areas of focal weakness or numbness.    ____________________________________________   PHYSICAL EXAM:  VITAL SIGNS: ED Triage Vitals  Enc Vitals Group     BP 01/17/20 0831 (!) 136/94     Pulse Rate 01/17/20 0831 77     Resp 01/17/20 0831 16     Temp 01/17/20 0831 98 F (36.7 C)     Temp Source 01/17/20 0831 Oral     SpO2 01/17/20 0831 100 %     Weight 01/17/20  0832 180 lb (81.6 kg)     Height 01/17/20 0832 5\' 6"  (1.676 m)     Head Circumference --      Peak Flow --      Pain Score 01/17/20 0831 7     Pain Loc --      Pain Edu? --      Excl. in GC? --     Constitutional: Alert and oriented. Well appearing and in no acute distress. Eyes: Conjunctivae are normal. Head: Atraumatic. Nose: No congestion/rhinnorhea. Mouth/Throat: Mucous membranes are moist. Neck: No stridor.  Cardiovascular: Normal rate, regular rhythm. Grossly normal heart sounds.  Good peripheral circulation. Respiratory: Normal respiratory effort.  No retractions. Lungs CTAB. Gastrointestinal: Soft and mild tenderness throughout seems to be nonfocal.  No distention.  Perhaps slightly increased bowel sounds.  Does have moderate tenderness to palpation in the right lower quadrant, also in the periumbilical area, but no noted rebound or guarding. No distention. Musculoskeletal: No lower extremity tenderness nor edema. Neurologic:  Normal speech and language. No gross focal neurologic deficits are appreciated.  Skin:  Skin is warm, dry and intact. No rash noted. Psychiatric: Mood and affect are normal. Speech and behavior are normal.  ____________________________________________   LABS (all labs ordered are listed, but only abnormal results are displayed)  Labs Reviewed  COMPREHENSIVE METABOLIC PANEL - Abnormal; Notable for the following components:      Result Value   Glucose, Bld 100 (*)    All other components within normal limits  URINALYSIS, COMPLETE (UACMP) WITH MICROSCOPIC - Abnormal; Notable for the following components:   Color, Urine YELLOW (*)    APPearance HAZY (*)    All other components within normal limits  RESPIRATORY PANEL BY RT PCR (FLU A&B, COVID)  GASTROINTESTINAL PANEL BY PCR, STOOL (REPLACES STOOL CULTURE)  C DIFFICILE QUICK SCREEN W PCR REFLEX  LIPASE, BLOOD  CBC    ____________________________________________  EKG   ____________________________________________  RADIOLOGY  CT ABDOMEN PELVIS W CONTRAST  Result Date: 01/17/2020 CLINICAL DATA:  Abdominal pain, diarrhea EXAM: CT ABDOMEN AND PELVIS WITH CONTRAST TECHNIQUE: Multidetector CT imaging of the abdomen and pelvis was performed using the standard protocol following bolus administration of intravenous contrast. CONTRAST:  01/19/2020 OMNIPAQUE IOHEXOL 300 MG/ML  SOLN COMPARISON:  04/18/2011 FINDINGS: Lower chest: No acute abnormality. Hepatobiliary: No focal liver abnormality is seen. No gallstones, gallbladder wall thickening, or biliary dilatation. Pancreas: Unremarkable. No pancreatic ductal dilatation or surrounding inflammatory changes. Spleen: Normal in size without focal abnormality. Adrenals/Urinary Tract: Unremarkable adrenal glands. Kidneys enhance symmetrically without focal lesion, stone, or hydronephrosis. Ureters are nondilated. Mild diffuse urinary bladder wall thickening, which may be accentuated by underdistention. Stomach/Bowel: Stomach is within normal limits. Appendix appears normal (series 4, image 46). No evidence of bowel  wall thickening, distention, or inflammatory changes. Vascular/Lymphatic: No significant vascular findings are present. No enlarged abdominal or pelvic lymph nodes. Reproductive: Prostate is unremarkable. Other: No free fluid. No abdominopelvic fluid collection. No pneumoperitoneum. Partially visualized bilateral hydroceles. No abdominal wall hernia. Musculoskeletal: No acute or significant osseous findings. IMPRESSION: 1. Mild diffuse urinary bladder wall thickening, which may be accentuated by underdistention. Correlate with urinalysis to exclude cystitis. 2. Otherwise, no acute abdominopelvic findings. Normal appendix. 3. Partially visualized bilateral hydroceles. Electronically Signed   By: Duanne Guess D.O.   On: 01/17/2020 11:39      CT imaging results  reviewed by me, some urinary bladder thickening.  No acute intra-abdominal findings otherwise  Clinical note the patient denies any urinary symptoms and his urinalysis does not appear with signs of infection or inflammation ____________________________________________   PROCEDURES  Procedure(s) performed: None  Procedures  Critical Care performed: No  ____________________________________________   INITIAL IMPRESSION / ASSESSMENT AND PLAN / ED COURSE  Pertinent labs & imaging results that were available during my care of the patient were reviewed by me and considered in my medical decision making (see chart for details).   Patient presents with loose stools for about 2 weeks.  No obvious risk factors such as antibiotic use or travel history regarding infectious causes.  His abdominal exam is nonspecific, but does endorse pain present for 2 weeks, will proceed with CT imaging to evaluate exclude etiologies such as colitis, appendicitis, cholecystitis, pancreatitis etc.  Little somewhat low pretest probability however for acute intra-abdominal pathology.  No cardiac or pulmonary symptoms.  Did have a mild cold about a week ago but reports he and son tested negative for Covid, discussed with the patient given his ongoing GI symptoms will test here today as well  Stool studies if able to collect, patient reports he had a bowel movement just prior to coming.  No associated urinary symptoms.  Reassuring lab work including a normal CBC, essentially normal metabolic panel and urinalysis  Pain relief, rehydration, antiemetics.  Reports very mild headache, nonspecific suspect likely dehydrational in nature will see if this improves after fluids    ----------------------------------------- 12:18 PM on 01/17/2020 -----------------------------------------  Patient feels improved.  Based on his clinical history I suspect this may be something such as irritable bowel she has reassuring work-up and  findings here does report a history of some abdominal intermittent cramping versus diarrhea and constipation.  Discussed with the patient recommended follow-up with his primary and consideration for GI follow-up as well.  Return precautions and treatment recommendations and follow-up discussed with the patient who is agreeable with the plan.  Patient reports he just received a prescription refill for Zofran which she can pick up does not need that, will trial Bentyl as well  ____________________________________________   FINAL CLINICAL IMPRESSION(S) / ED DIAGNOSES  Final diagnoses:  Loose stools  Abdominal cramping        Note:  This document was prepared using Dragon voice recognition software and may include unintentional dictation errors       Sharyn Creamer, MD 01/17/20 1219

## 2020-01-18 LAB — GASTROINTESTINAL PANEL BY PCR, STOOL (REPLACES STOOL CULTURE)

## 2020-04-30 ENCOUNTER — Emergency Department
Admission: EM | Admit: 2020-04-30 | Discharge: 2020-04-30 | Disposition: A | Discharge status: Home / Self Care | Payer: Medicaid Other

## 2020-09-05 DIAGNOSIS — E291 Testicular hypofunction: Secondary | ICD-10-CM | POA: Diagnosis not present

## 2020-09-05 DIAGNOSIS — M1990 Unspecified osteoarthritis, unspecified site: Secondary | ICD-10-CM | POA: Diagnosis not present

## 2020-09-05 DIAGNOSIS — K589 Irritable bowel syndrome without diarrhea: Secondary | ICD-10-CM | POA: Diagnosis not present

## 2020-09-05 DIAGNOSIS — F341 Dysthymic disorder: Secondary | ICD-10-CM | POA: Diagnosis not present

## 2020-09-05 DIAGNOSIS — R110 Nausea: Secondary | ICD-10-CM | POA: Diagnosis not present

## 2020-09-05 DIAGNOSIS — F331 Major depressive disorder, recurrent, moderate: Secondary | ICD-10-CM | POA: Diagnosis not present

## 2020-09-05 DIAGNOSIS — E669 Obesity, unspecified: Secondary | ICD-10-CM | POA: Diagnosis not present

## 2020-09-23 ENCOUNTER — Emergency Department: Payer: Medicaid Other

## 2020-09-23 ENCOUNTER — Emergency Department
Admission: EM | Admit: 2020-09-23 | Discharge: 2020-09-23 | Disposition: A | Discharge status: Home / Self Care | Payer: Medicaid Other | Attending: Student in an Organized Health Care Education/Training Program | Admitting: Student in an Organized Health Care Education/Training Program

## 2020-09-23 ENCOUNTER — Other Ambulatory Visit: Payer: Self-pay

## 2020-09-23 DIAGNOSIS — F17210 Nicotine dependence, cigarettes, uncomplicated: Secondary | ICD-10-CM | POA: Diagnosis not present

## 2020-09-23 DIAGNOSIS — W450XXA Nail entering through skin, initial encounter: Secondary | ICD-10-CM | POA: Insufficient documentation

## 2020-09-23 DIAGNOSIS — Z23 Encounter for immunization: Secondary | ICD-10-CM | POA: Insufficient documentation

## 2020-09-23 DIAGNOSIS — S99922A Unspecified injury of left foot, initial encounter: Secondary | ICD-10-CM | POA: Diagnosis not present

## 2020-09-23 DIAGNOSIS — S91332A Puncture wound without foreign body, left foot, initial encounter: Secondary | ICD-10-CM | POA: Diagnosis not present

## 2020-09-23 DIAGNOSIS — T148XXA Other injury of unspecified body region, initial encounter: Secondary | ICD-10-CM

## 2020-09-23 MED ORDER — CEPHALEXIN 500 MG PO CAPS: 500.0000 mg | ORAL_CAPSULE | Freq: Three times a day (TID) | ORAL | 0 refills | Status: AC

## 2020-09-23 MED ORDER — TETANUS-DIPHTH-ACELL PERTUSSIS 5-2.5-18.5 LF-MCG/0.5 IM SUSY
0.5000 mL | PREFILLED_SYRINGE | Freq: Once | INTRAMUSCULAR | Status: AC
  Administered 2020-09-23: 0.5 mL via INTRAMUSCULAR
  Filled 2020-09-23: qty 0.5

## 2020-09-23 NOTE — ED Triage Notes (Signed)
Pt states he stepped on a nail with shoes in place tonight. Pt with puncture wound to left foot.

## 2020-09-23 NOTE — ED Provider Notes (Signed)
[facility]-EMERGENCY DEPARTMENT  ____________________________________________  Time seen: Approximately 10:46 PM  I have reviewed the triage vital signs and the nursing notes.   HISTORY  Chief Complaint Puncture Wound   Historian Patient    HPI Dakota Oliver is a 30 y.o. male presents to the emergency department after patient stepped on a nail.  Patient has small puncture wound along the plantar aspect of the left foot.  He cannot remember his last tetanus shot.  No numbness or tingling.  No other alleviating measures have been attempted.   Past Medical History:  Diagnosis Date   Depression    Low testosterone      Immunizations up to date:  Yes.     Past Medical History:  Diagnosis Date   Depression    Low testosterone     There are no problems to display for this patient.   Past Surgical History:  Procedure Laterality Date   HYDROCELE EXCISION / REPAIR     Shrapnel removal from chest      Prior to Admission medications   Medication Sig Start Date End Date Taking? Authorizing Provider  cephALEXin (KEFLEX) 500 MG capsule Take 1 capsule (500 mg total) by mouth 3 (three) times daily for 7 days. 09/23/20 09/30/20 Yes Pia Mau M, PA-C  amphetamine-dextroamphetamine (ADDERALL XR) 20 MG 24 hr capsule Take 20 mg by mouth every morning. 04/18/16   [provider]  chlordiazePOXIDE (LIBRIUM) 25 MG capsule Take 1 tablet by mouth 5 times a day on day 1, then decrease by 1 tablet daily until gone. 02/19/19   Emily Filbert, MD  dicyclomine (BENTYL) 10 MG capsule Take 1 capsule (10 mg total) by mouth 3 (three) times daily as needed for up to 5 days for spasms. 01/17/20 01/22/20  Sharyn Creamer, MD  escitalopram (LEXAPRO) 20 MG tablet Take 20 mg by mouth daily.    [provider]  FLUoxetine (PROZAC) 20 MG capsule Take 20 mg by mouth every morning. 05/08/16   [provider]  hydrOXYzine (ATARAX/VISTARIL) 25 MG tablet Take 25 mg by mouth 3  (three) times daily as needed.    [provider]  ibuprofen (ADVIL,MOTRIN) 800 MG tablet Take 1 tablet (800 mg total) by mouth every 8 (eight) hours as needed. 06/12/16   Vanna Scotland, MD  naproxen (NAPROSYN) 500 MG tablet Take 1 tablet (500 mg total) by mouth 2 (two) times daily with a meal. 03/18/18   Joni Reining, PA-C    Allergies Prochlorperazine  Family History  Problem Relation Age of Onset   Prostate cancer Neg Hx    Kidney cancer Neg Hx    Bladder Cancer Neg Hx     Social History Social History   Tobacco Use   Smoking status: Every Day    Packs/day: 0.50    Pack years: 0.00    Types: Cigarettes   Smokeless tobacco: Never  Vaping Use   Vaping Use: Never used  Substance Use Topics   Alcohol use: Yes   Drug use: Not Currently     Review of Systems  Constitutional: No fever/chills Eyes:  No discharge ENT: No upper respiratory complaints. Respiratory: no cough. No SOB/ use of accessory muscles to breath Gastrointestinal:   No nausea, no vomiting.  No diarrhea.  No constipation. Musculoskeletal: Negative for musculoskeletal pain. Skin: Patient has puncture wound.    ____________________________________________   PHYSICAL EXAM:  VITAL SIGNS: ED Triage Vitals  Enc Vitals Group     BP 09/23/20 1949  129/88     Pulse Rate 09/23/20 1949 87     Resp 09/23/20 1949 17     Temp 09/23/20 1949 98.5 F (36.9 C)     Temp Source 09/23/20 1949 Oral     SpO2 09/23/20 1949 98 %     Weight 09/23/20 1940 185 lb (83.9 kg)     Height 09/23/20 1940 5\' 6"  (1.676 m)     Head Circumference --      Peak Flow --      Pain Score 09/23/20 1940 10     Pain Loc --      Pain Edu? --      Excl. in GC? --      Constitutional: Alert and oriented. Well appearing and in no acute distress. Eyes: Conjunctivae are normal. PERRL. EOMI. Head: Atraumatic. ENT: Cardiovascular: Normal rate, regular rhythm. Normal S1 and S2.  Good peripheral circulation. Respiratory:  Normal respiratory effort without tachypnea or retractions. Lungs CTAB. Good air entry to the bases with no decreased or absent breath sounds Gastrointestinal: Bowel sounds x 4 quadrants. Soft and nontender to palpation. No guarding or rigidity. No distention. Musculoskeletal: Full range of motion to all extremities. No obvious deformities noted Neurologic:  Normal for age. No gross focal neurologic deficits are appreciated.  Skin: Patient has small puncture wound along the plantar aspect of the left foot.  Palpable dorsalis pedis pulse bilaterally and symmetrically.  Capillary refill less than 2 seconds. Psychiatric: Mood and affect are normal for age. Speech and behavior are normal.   ____________________________________________   LABS (all labs ordered are listed, but only abnormal results are displayed)  Labs Reviewed - No data to display ____________________________________________  EKG   ____________________________________________  RADIOLOGY 09/25/20, personally viewed and evaluated these images (plain radiographs) as part of my medical decision making, as well as reviewing the written report by the radiologist.  DG Foot Complete Left  Result Date: 09/23/2020 CLINICAL DATA:  Stepped on nail. EXAM: LEFT FOOT - COMPLETE 3+ VIEW COMPARISON:  None. FINDINGS: No acute bony abnormality. Specifically, no fracture, subluxation, or dislocation. No radiopaque foreign body. IMPRESSION: No fracture or foreign body Electronically Signed   By: 09/25/2020 M.D.   On: 09/23/2020 20:21    ____________________________________________    PROCEDURES  Procedure(s) performed:     Procedures     Medications  Tdap (BOOSTRIX) injection 0.5 mL (0.5 mLs Intramuscular Given 09/23/20 2129)     ____________________________________________   INITIAL IMPRESSION / ASSESSMENT AND PLAN / ED COURSE  Pertinent labs & imaging results that were available during my care of the patient  were reviewed by me and considered in my medical decision making (see chart for details).      Assessment and plan Puncture wound 30 year old male presents to the emergency department with a small puncture wound along the plantar aspect of the left foot.  No bony abnormalities or retained foreign bodies were visualized on x-ray.  Patient was discharged with Keflex and tetanus status was updated in the emergency department.  All patient questions were answered.     ____________________________________________  FINAL CLINICAL IMPRESSION(S) / ED DIAGNOSES  Final diagnoses:  Puncture wound      NEW MEDICATIONS STARTED DURING THIS VISIT:  ED Discharge Orders          Ordered    cephALEXin (KEFLEX) 500 MG capsule  3 times daily        09/23/20 2121  This chart was dictated using voice recognition software/Dragon. Despite best efforts to proofread, errors can occur which can change the meaning. Any change was purely unintentional.     Orvil Feil, PA-C 09/23/20 2248    Willy Eddy, MD 09/23/20 2306

## 2020-09-23 NOTE — Discharge Instructions (Addendum)
Take Keflex three times a day for seven days.

## 2020-11-19 DIAGNOSIS — E291 Testicular hypofunction: Secondary | ICD-10-CM | POA: Diagnosis not present

## 2020-11-19 DIAGNOSIS — I10 Essential (primary) hypertension: Secondary | ICD-10-CM | POA: Diagnosis not present

## 2020-11-19 DIAGNOSIS — E669 Obesity, unspecified: Secondary | ICD-10-CM | POA: Diagnosis not present

## 2020-11-19 DIAGNOSIS — R7301 Impaired fasting glucose: Secondary | ICD-10-CM | POA: Diagnosis not present

## 2020-11-21 DIAGNOSIS — M1990 Unspecified osteoarthritis, unspecified site: Secondary | ICD-10-CM | POA: Diagnosis not present

## 2020-11-21 DIAGNOSIS — K589 Irritable bowel syndrome without diarrhea: Secondary | ICD-10-CM | POA: Diagnosis not present

## 2020-11-21 DIAGNOSIS — R110 Nausea: Secondary | ICD-10-CM | POA: Diagnosis not present

## 2020-11-21 DIAGNOSIS — F909 Attention-deficit hyperactivity disorder, unspecified type: Secondary | ICD-10-CM | POA: Diagnosis not present

## 2020-11-21 DIAGNOSIS — I10 Essential (primary) hypertension: Secondary | ICD-10-CM | POA: Diagnosis not present

## 2020-11-21 DIAGNOSIS — F331 Major depressive disorder, recurrent, moderate: Secondary | ICD-10-CM | POA: Diagnosis not present

## 2020-11-21 DIAGNOSIS — E669 Obesity, unspecified: Secondary | ICD-10-CM | POA: Diagnosis not present

## 2020-11-21 DIAGNOSIS — E291 Testicular hypofunction: Secondary | ICD-10-CM | POA: Diagnosis not present

## 2020-11-21 DIAGNOSIS — F341 Dysthymic disorder: Secondary | ICD-10-CM | POA: Diagnosis not present

## 2020-12-10 DIAGNOSIS — K589 Irritable bowel syndrome without diarrhea: Secondary | ICD-10-CM | POA: Diagnosis not present

## 2020-12-10 DIAGNOSIS — F341 Dysthymic disorder: Secondary | ICD-10-CM | POA: Diagnosis not present

## 2020-12-10 DIAGNOSIS — R110 Nausea: Secondary | ICD-10-CM | POA: Diagnosis not present

## 2020-12-10 DIAGNOSIS — E669 Obesity, unspecified: Secondary | ICD-10-CM | POA: Diagnosis not present

## 2020-12-10 DIAGNOSIS — E291 Testicular hypofunction: Secondary | ICD-10-CM | POA: Diagnosis not present

## 2020-12-10 DIAGNOSIS — I10 Essential (primary) hypertension: Secondary | ICD-10-CM | POA: Diagnosis not present

## 2020-12-10 DIAGNOSIS — M1990 Unspecified osteoarthritis, unspecified site: Secondary | ICD-10-CM | POA: Diagnosis not present

## 2020-12-10 DIAGNOSIS — F909 Attention-deficit hyperactivity disorder, unspecified type: Secondary | ICD-10-CM | POA: Diagnosis not present

## 2020-12-10 DIAGNOSIS — F331 Major depressive disorder, recurrent, moderate: Secondary | ICD-10-CM | POA: Diagnosis not present

## 2021-01-14 DIAGNOSIS — Z20822 Contact with and (suspected) exposure to covid-19: Secondary | ICD-10-CM | POA: Diagnosis not present

## 2021-01-14 DIAGNOSIS — Z03818 Encounter for observation for suspected exposure to other biological agents ruled out: Secondary | ICD-10-CM | POA: Diagnosis not present

## 2021-01-15 DIAGNOSIS — Z03818 Encounter for observation for suspected exposure to other biological agents ruled out: Secondary | ICD-10-CM | POA: Diagnosis not present

## 2021-01-15 DIAGNOSIS — Z20822 Contact with and (suspected) exposure to covid-19: Secondary | ICD-10-CM | POA: Diagnosis not present

## 2021-01-15 DIAGNOSIS — B349 Viral infection, unspecified: Secondary | ICD-10-CM | POA: Diagnosis not present

## 2021-01-16 DIAGNOSIS — R110 Nausea: Secondary | ICD-10-CM | POA: Diagnosis not present

## 2021-01-16 DIAGNOSIS — E291 Testicular hypofunction: Secondary | ICD-10-CM | POA: Diagnosis not present

## 2021-01-16 DIAGNOSIS — F341 Dysthymic disorder: Secondary | ICD-10-CM | POA: Diagnosis not present

## 2021-01-16 DIAGNOSIS — F909 Attention-deficit hyperactivity disorder, unspecified type: Secondary | ICD-10-CM | POA: Diagnosis not present

## 2021-01-16 DIAGNOSIS — B349 Viral infection, unspecified: Secondary | ICD-10-CM | POA: Diagnosis not present

## 2021-01-16 DIAGNOSIS — E669 Obesity, unspecified: Secondary | ICD-10-CM | POA: Diagnosis not present

## 2021-01-16 DIAGNOSIS — K589 Irritable bowel syndrome without diarrhea: Secondary | ICD-10-CM | POA: Diagnosis not present

## 2021-01-16 DIAGNOSIS — F331 Major depressive disorder, recurrent, moderate: Secondary | ICD-10-CM | POA: Diagnosis not present

## 2021-01-16 DIAGNOSIS — I10 Essential (primary) hypertension: Secondary | ICD-10-CM | POA: Diagnosis not present

## 2021-01-16 DIAGNOSIS — M1990 Unspecified osteoarthritis, unspecified site: Secondary | ICD-10-CM | POA: Diagnosis not present

## 2021-01-23 DIAGNOSIS — Z03818 Encounter for observation for suspected exposure to other biological agents ruled out: Secondary | ICD-10-CM | POA: Diagnosis not present

## 2021-01-23 DIAGNOSIS — Z20822 Contact with and (suspected) exposure to covid-19: Secondary | ICD-10-CM | POA: Diagnosis not present

## 2021-01-28 DIAGNOSIS — Z20822 Contact with and (suspected) exposure to covid-19: Secondary | ICD-10-CM | POA: Diagnosis not present

## 2021-01-28 DIAGNOSIS — B349 Viral infection, unspecified: Secondary | ICD-10-CM | POA: Diagnosis not present

## 2021-01-29 ENCOUNTER — Other Ambulatory Visit: Payer: Self-pay | Admitting: Internal Medicine

## 2021-01-29 DIAGNOSIS — R059 Cough, unspecified: Secondary | ICD-10-CM

## 2021-01-30 ENCOUNTER — Ambulatory Visit
Admission: RE | Admit: 2021-01-30 | Discharge: 2021-01-30 | Disposition: A | Discharge status: Home / Self Care | Payer: Medicaid Other | Source: Ambulatory Visit | Attending: Internal Medicine | Admitting: Internal Medicine

## 2021-01-30 ENCOUNTER — Other Ambulatory Visit: Payer: Self-pay

## 2021-01-30 DIAGNOSIS — R059 Cough, unspecified: Secondary | ICD-10-CM | POA: Insufficient documentation

## 2021-01-30 DIAGNOSIS — R0602 Shortness of breath: Secondary | ICD-10-CM | POA: Diagnosis not present

## 2021-02-04 DIAGNOSIS — I10 Essential (primary) hypertension: Secondary | ICD-10-CM | POA: Diagnosis not present

## 2021-02-04 DIAGNOSIS — E669 Obesity, unspecified: Secondary | ICD-10-CM | POA: Diagnosis not present

## 2021-02-04 DIAGNOSIS — Z1331 Encounter for screening for depression: Secondary | ICD-10-CM | POA: Diagnosis not present

## 2021-02-04 DIAGNOSIS — S46912A Strain of unspecified muscle, fascia and tendon at shoulder and upper arm level, left arm, initial encounter: Secondary | ICD-10-CM | POA: Diagnosis not present

## 2021-02-04 DIAGNOSIS — M1990 Unspecified osteoarthritis, unspecified site: Secondary | ICD-10-CM | POA: Diagnosis not present

## 2021-02-04 DIAGNOSIS — F909 Attention-deficit hyperactivity disorder, unspecified type: Secondary | ICD-10-CM | POA: Diagnosis not present

## 2021-02-04 DIAGNOSIS — E291 Testicular hypofunction: Secondary | ICD-10-CM | POA: Diagnosis not present

## 2021-02-04 DIAGNOSIS — F341 Dysthymic disorder: Secondary | ICD-10-CM | POA: Diagnosis not present

## 2021-02-04 DIAGNOSIS — K589 Irritable bowel syndrome without diarrhea: Secondary | ICD-10-CM | POA: Diagnosis not present

## 2021-02-04 DIAGNOSIS — F331 Major depressive disorder, recurrent, moderate: Secondary | ICD-10-CM | POA: Diagnosis not present

## 2021-02-04 DIAGNOSIS — R110 Nausea: Secondary | ICD-10-CM | POA: Diagnosis not present

## 2021-02-12 ENCOUNTER — Emergency Department: Payer: Medicaid Other

## 2021-02-12 ENCOUNTER — Other Ambulatory Visit: Payer: Self-pay

## 2021-02-12 ENCOUNTER — Encounter: Payer: Self-pay | Admitting: Emergency Medicine

## 2021-02-12 DIAGNOSIS — F17210 Nicotine dependence, cigarettes, uncomplicated: Secondary | ICD-10-CM | POA: Insufficient documentation

## 2021-02-12 DIAGNOSIS — R0789 Other chest pain: Secondary | ICD-10-CM | POA: Insufficient documentation

## 2021-02-12 DIAGNOSIS — R0602 Shortness of breath: Secondary | ICD-10-CM | POA: Insufficient documentation

## 2021-02-12 DIAGNOSIS — R079 Chest pain, unspecified: Secondary | ICD-10-CM | POA: Diagnosis not present

## 2021-02-12 LAB — CBC
HCT: 41.3 % (ref 39.0–52.0)
Hemoglobin: 15 g/dL (ref 13.0–17.0)
MCH: 31.7 pg (ref 26.0–34.0)
MCHC: 36.3 g/dL — ABNORMAL HIGH (ref 30.0–36.0)
MCV: 87.3 fL (ref 80.0–100.0)
Platelets: 290 10*3/uL (ref 150–400)
RBC: 4.73 MIL/uL (ref 4.22–5.81)
RDW: 12.6 % (ref 11.5–15.5)
WBC: 13.6 10*3/uL — ABNORMAL HIGH (ref 4.0–10.5)
nRBC: 0 % (ref 0.0–0.2)

## 2021-02-12 NOTE — ED Triage Notes (Signed)
Patient ambulatory to triage with steady gait, without difficulty or distress noted; pt reports mid CP radiating to left side accomp by [facility] tonight; denies hx of same, denies any recent illness

## 2021-02-13 ENCOUNTER — Emergency Department
Admission: EM | Admit: 2021-02-13 | Discharge: 2021-02-13 | Disposition: A | Discharge status: Home / Self Care | Payer: Medicaid Other | Attending: Emergency Medicine | Admitting: Emergency Medicine

## 2021-02-13 DIAGNOSIS — R0789 Other chest pain: Secondary | ICD-10-CM

## 2021-02-13 HISTORY — DX: Unspecified osteoarthritis, unspecified site: M19.90

## 2021-02-13 LAB — BASIC METABOLIC PANEL
Anion gap: 9 (ref 5–15)
BUN: 10 mg/dL (ref 6–20)
CO2: 23 mmol/L (ref 22–32)
Calcium: 9.4 mg/dL (ref 8.9–10.3)
Chloride: 105 mmol/L (ref 98–111)
Creatinine, Ser: 0.77 mg/dL (ref 0.61–1.24)
GFR, Estimated: >60 mL/min (ref >60)
Glucose, Bld: 88 mg/dL (ref 70–99)
Potassium: 3.7 mmol/L (ref 3.5–5.1)
Sodium: 137 mmol/L (ref 135–145)

## 2021-02-13 LAB — TROPONIN I (HIGH SENSITIVITY)
Troponin I (High Sensitivity): <2 ng/L (ref <18)
Troponin I (High Sensitivity): <2 ng/L (ref <18)

## 2021-02-13 LAB — MAGNESIUM: Magnesium: 2.1 mg/dL (ref 1.7–2.4)

## 2021-02-13 NOTE — Discharge Instructions (Signed)
You may alternate Tylenol 1000 mg every 6 hours as needed for pain, fever and Ibuprofen 800 mg every 8 hours as needed for pain, fever.  Please take Ibuprofen with food.  Do not take more than 4000 mg of Tylenol (acetaminophen) in a 24 hour period.  

## 2021-02-13 NOTE — ED Provider Notes (Signed)
[facility] Emergency Department Provider Note  ____________________________________________   Event Date/Time   First MD Initiated Contact with Patient 02/13/21 [phone]     (approximate)  I have reviewed the triage vital signs and the nursing notes.   HISTORY  Chief Complaint Chest Pain    HPI Dakota Oliver is a 30 y.o. male with history of depression who presents to the emergency department with intermittent left sided chest pain for the past couple of days that he describes as sharp and "expanding".  Denies any aggravating or alleviating factors.  Has associated shortness of breath.  No fevers, cough, nausea, vomiting.  No history of PE, DVT, exogenous estrogen use, recent fractures, surgery, trauma, hospitalization, prolonged travel or other immobilization. No lower extremity swelling or pain. No calf tenderness.         Past Medical History:  Diagnosis Date   Arthritis    Depression    Low testosterone     There are no problems to display for this patient.   Past Surgical History:  Procedure Laterality Date   HYDROCELE EXCISION / REPAIR     Shrapnel removal from chest      Prior to Admission medications   Medication Sig Start Date End Date Taking? Authorizing Provider  amphetamine-dextroamphetamine (ADDERALL) 20 MG tablet Take 20 mg by mouth 2 (two) times daily.   Yes [provider]  cyclobenzaprine (FLEXERIL) 10 MG tablet Take 10 mg by mouth 3 (three) times daily as needed for muscle spasms.   Yes [provider]  diazepam (VALIUM) 5 MG tablet Take 5 mg by mouth every 8 (eight) hours as needed for anxiety.   Yes [provider]  dicyclomine (BENTYL) 10 MG capsule Take 1 capsule (10 mg total) by mouth 3 (three) times daily as needed for up to 5 days for spasms. 01/17/20 02/13/21 Yes Sharyn Creamer, MD  ibuprofen (ADVIL,MOTRIN) 800 MG tablet Take 1 tablet (800 mg total) by mouth every 8 (eight) hours as needed.  06/12/16  Yes Vanna Scotland, MD  sertraline (ZOLOFT) 100 MG tablet Take 200 mg by mouth daily.   Yes [provider]  amphetamine-dextroamphetamine (ADDERALL XR) 20 MG 24 hr capsule Take 20 mg by mouth every morning. 04/18/16   [provider]  chlordiazePOXIDE (LIBRIUM) 25 MG capsule Take 1 tablet by mouth 5 times a day on day 1, then decrease by 1 tablet daily until gone. 02/19/19   Emily Filbert, MD  escitalopram (LEXAPRO) 20 MG tablet Take 20 mg by mouth daily.    [provider]  FLUoxetine (PROZAC) 20 MG capsule Take 20 mg by mouth every morning. 05/08/16   [provider]  hydrOXYzine (ATARAX/VISTARIL) 25 MG tablet Take 25 mg by mouth 3 (three) times daily as needed.    [provider]  naproxen (NAPROSYN) 500 MG tablet Take 1 tablet (500 mg total) by mouth 2 (two) times daily with a meal. 03/18/18   Joni Reining, PA-C    Allergies Prochlorperazine  Family History  Problem Relation Age of Onset   Prostate cancer Neg Hx    Kidney cancer Neg Hx    Bladder Cancer Neg Hx     Social History Social History   Tobacco Use   Smoking status: Every Day    Packs/day: 0.50    Types: Cigarettes   Smokeless tobacco: Never  Vaping Use   Vaping Use: Never used  Substance Use Topics   Alcohol use: Yes  Drug use: Not Currently    Review of Systems Constitutional: No fever. Eyes: No visual changes. ENT: No sore throat. Cardiovascular: + chest pain. Respiratory: + shortness of breath. Gastrointestinal: No nausea, vomiting, diarrhea. Genitourinary: Negative for dysuria. Musculoskeletal: Negative for back pain. Skin: Negative for rash. Neurological: Negative for focal weakness or numbness.  ____________________________________________   PHYSICAL EXAM:  VITAL SIGNS: ED Triage Vitals  Enc Vitals Group     BP 02/12/21 2328 130/86     Pulse Rate 02/12/21 2328 100     Resp 02/12/21 2328 18     Temp 02/12/21 2328 98.5 F (36.9  C)     Temp Source 02/12/21 2328 Oral     SpO2 02/12/21 2328 98 %     Weight 02/12/21 2317 160 lb (72.6 kg)     Height 02/12/21 2317 5\' 6"  (1.676 m)     Head Circumference --      Peak Flow --      Pain Score 02/12/21 2317 7     Pain Loc --      Pain Edu? --      Excl. in GC? --    CONSTITUTIONAL: Alert and oriented and responds appropriately to questions. Well-appearing; well-nourished HEAD: Normocephalic EYES: Conjunctivae clear, pupils appear equal, EOM appear intact ENT: normal nose; moist mucous membranes NECK: Supple, normal ROM CARD: RRR; S1 and S2 appreciated; no murmurs, no clicks, no rubs, no gallops CHEST:  Chest wall is nontender to palpation.  No crepitus, ecchymosis, erythema, warmth, rash or other lesions present.   RESP: Normal chest excursion without splinting or tachypnea; breath sounds clear and equal bilaterally; no wheezes, no rhonchi, no rales, no hypoxia or respiratory distress, speaking full sentences ABD/GI: Normal bowel sounds; non-distended; soft, non-tender, no rebound, no guarding, no peritoneal signs, no hepatosplenomegaly BACK: The back appears normal EXT: Normal ROM in all joints; no deformity noted, no edema; no cyanosis, no calf tenderness or calf swelling SKIN: Normal color for age and race; warm; no rash on exposed skin NEURO: Moves all extremities equally PSYCH: The patient's mood and manner are appropriate.  ____________________________________________   LABS (all labs ordered are listed, but only abnormal results are displayed)  Labs Reviewed  CBC - Abnormal; Notable for the following components:      Result Value   WBC 13.6 (*)    MCHC 36.3 (*)    All other components within normal limits  BASIC METABOLIC PANEL  MAGNESIUM  TROPONIN I (HIGH SENSITIVITY)  TROPONIN I (HIGH SENSITIVITY)   ____________________________________________  EKG   Date: 02/12/2021 23:18  Rate: 101  Rhythm: Sinus tachycardia  QRS Axis: normal  Intervals:  normal  ST/T Wave abnormalities: normal  Conduction Disutrbances: none  Narrative Interpretation: Sinus tachycardia    ____________________________________________  RADIOLOGY I, Loren Vicens, personally viewed and evaluated these images (plain radiographs) as part of my medical decision making, as well as reviewing the written report by the radiologist.  ED MD interpretation: Chest x-ray clear.  Official radiology report(s): DG Chest 2 View  Result Date: 02/12/2021 CLINICAL DATA:  Chest pain and shortness of breath EXAM: CHEST - 2 VIEW COMPARISON:  01/30/2021 FINDINGS: The heart size and mediastinal contours are within normal limits. Both lungs are clear. The visualized skeletal structures are unremarkable. IMPRESSION: No active cardiopulmonary disease. Electronically Signed   By: 13/05/2020 M.D.   On: 02/12/2021 23:42    ____________________________________________   PROCEDURES  Procedure(s) performed (including Critical Care):  Procedures    ____________________________________________  INITIAL IMPRESSION / ASSESSMENT AND PLAN / ED COURSE  As part of my medical decision making, I reviewed the following data within the electronic MEDICAL RECORD NUMBER Nursing notes reviewed and incorporated, Labs reviewed , EKG interpreted , Old EKG reviewed, Old chart reviewed, Radiograph reviewed , and Notes from prior ED visits         Patient here with complaints of atypical chest pain.  Low suspicion for ACS, PE or dissection.  EKG reviewed and shows no ischemia, interval change or arrhythmia.  Chest x-ray obtained from triage and reviewed by myself and radiologist shows no acute abnormality including no pneumothorax, edema or infiltrate.  Troponin x1 negative.  Electrolytes, hemoglobin normal.  He is requesting that we check his magnesium level today.  Does have history of heavy alcohol use and I feel it is reasonable to check his electrolytes.  Second troponin pending.  Patient is PERC  negative.  ED PROGRESS  Patient second troponin is negative.  I feel he is safe to be discharged.  Hemodynamically stable here.  Recommended alternating Tylenol, Motrin for discomfort.  Given outpatient follow-up as needed.  At this time, I do not feel there is any life-threatening condition present. I have reviewed, interpreted and discussed all results (EKG, imaging, lab, urine as appropriate) and exam findings with patient/family. I have reviewed nursing notes and appropriate previous records.  I feel the patient is safe to be discharged home without further emergent workup and can continue workup as an outpatient as needed. Discussed usual and customary return precautions. Patient/family verbalize understanding and are comfortable with this plan.  Outpatient follow-up has been provided as needed. All questions have been answered.  ____________________________________________   FINAL CLINICAL IMPRESSION(S) / ED DIAGNOSES  Final diagnoses:  Atypical chest pain     ED Discharge Orders     None       *Please note:  VI WHITESEL was evaluated in Emergency Department on 02/13/2021 for the symptoms described in the history of present illness. He was evaluated in the context of the global COVID-19 pandemic, which necessitated consideration that the patient might be at risk for infection with the SARS-CoV-2 virus that causes COVID-19. Institutional protocols and algorithms that pertain to the evaluation of patients at risk for COVID-19 are in a state of rapid change based on information released by regulatory bodies including the CDC and federal and state organizations. These policies and algorithms were followed during the patient's care in the ED.  Some ED evaluations and interventions may be delayed as a result of limited staffing during and the pandemic.*   Note:  This document was prepared using Dragon voice recognition software and may include unintentional dictation errors.     Kelvin Burpee, Layla Maw, DO 02/13/21 [phone]

## 2021-02-13 NOTE — ED Notes (Signed)
Pt reports central chest pain at rest last night at work with radiating pain through to back. Pt reports same occurrence/symptoms 6 weeks prior. Pt takes adderrall 40mg  BID for past 6 months. Pt also reports taking his prescribed Valium 10mg  when episode of chest pain occurred last night at approx 2145.

## 2021-02-25 DIAGNOSIS — Z03818 Encounter for observation for suspected exposure to other biological agents ruled out: Secondary | ICD-10-CM | POA: Diagnosis not present

## 2021-02-25 DIAGNOSIS — Z20822 Contact with and (suspected) exposure to covid-19: Secondary | ICD-10-CM | POA: Diagnosis not present

## 2021-03-18 DIAGNOSIS — F331 Major depressive disorder, recurrent, moderate: Secondary | ICD-10-CM | POA: Diagnosis not present

## 2021-03-18 DIAGNOSIS — S46912A Strain of unspecified muscle, fascia and tendon at shoulder and upper arm level, left arm, initial encounter: Secondary | ICD-10-CM | POA: Diagnosis not present

## 2021-03-18 DIAGNOSIS — M1990 Unspecified osteoarthritis, unspecified site: Secondary | ICD-10-CM | POA: Diagnosis not present

## 2021-03-18 DIAGNOSIS — I10 Essential (primary) hypertension: Secondary | ICD-10-CM | POA: Diagnosis not present

## 2021-03-18 DIAGNOSIS — K589 Irritable bowel syndrome without diarrhea: Secondary | ICD-10-CM | POA: Diagnosis not present

## 2021-03-18 DIAGNOSIS — F341 Dysthymic disorder: Secondary | ICD-10-CM | POA: Diagnosis not present

## 2021-03-18 DIAGNOSIS — E291 Testicular hypofunction: Secondary | ICD-10-CM | POA: Diagnosis not present

## 2021-03-18 DIAGNOSIS — F909 Attention-deficit hyperactivity disorder, unspecified type: Secondary | ICD-10-CM | POA: Diagnosis not present

## 2021-03-18 DIAGNOSIS — R110 Nausea: Secondary | ICD-10-CM | POA: Diagnosis not present

## 2021-03-18 DIAGNOSIS — E669 Obesity, unspecified: Secondary | ICD-10-CM | POA: Diagnosis not present

## 2021-04-05 ENCOUNTER — Emergency Department: Payer: Medicaid Other

## 2021-04-05 ENCOUNTER — Emergency Department
Admission: EM | Admit: 2021-04-05 | Discharge: 2021-04-05 | Disposition: A | Discharge status: Home / Self Care | Payer: Medicaid Other | Attending: Emergency Medicine | Admitting: Emergency Medicine

## 2021-04-05 ENCOUNTER — Other Ambulatory Visit: Payer: Self-pay

## 2021-04-05 DIAGNOSIS — Z5321 Procedure and treatment not carried out due to patient leaving prior to being seen by health care provider: Secondary | ICD-10-CM | POA: Insufficient documentation

## 2021-04-05 DIAGNOSIS — M542 Cervicalgia: Secondary | ICD-10-CM | POA: Insufficient documentation

## 2021-04-05 DIAGNOSIS — M25512 Pain in left shoulder: Secondary | ICD-10-CM | POA: Insufficient documentation

## 2021-04-05 DIAGNOSIS — R109 Unspecified abdominal pain: Secondary | ICD-10-CM | POA: Insufficient documentation

## 2021-04-05 DIAGNOSIS — R1110 Vomiting, unspecified: Secondary | ICD-10-CM | POA: Diagnosis not present

## 2021-04-05 DIAGNOSIS — R079 Chest pain, unspecified: Secondary | ICD-10-CM | POA: Insufficient documentation

## 2021-04-05 DIAGNOSIS — M25511 Pain in right shoulder: Secondary | ICD-10-CM | POA: Diagnosis not present

## 2021-04-05 DIAGNOSIS — R0789 Other chest pain: Secondary | ICD-10-CM | POA: Diagnosis not present

## 2021-04-05 LAB — CBC WITH DIFFERENTIAL/PLATELET
Abs Immature Granulocytes: 0.11 10*3/uL — ABNORMAL HIGH (ref 0.00–0.07)
Basophils Absolute: 0 10*3/uL (ref 0.0–0.1)
Basophils Relative: 0 %
Eosinophils Absolute: 0.1 10*3/uL (ref 0.0–0.5)
Eosinophils Relative: 1 %
HCT: 51.6 % (ref 39.0–52.0)
Hemoglobin: 17.9 g/dL — ABNORMAL HIGH (ref 13.0–17.0)
Immature Granulocytes: 1 %
Lymphocytes Relative: 3 %
Lymphs Abs: 0.4 10*3/uL — ABNORMAL LOW (ref 0.7–4.0)
MCH: 30.8 pg (ref 26.0–34.0)
MCHC: 34.7 g/dL (ref 30.0–36.0)
MCV: 88.7 fL (ref 80.0–100.0)
Monocytes Absolute: 1 10*3/uL (ref 0.1–1.0)
Monocytes Relative: 5 %
Neutro Abs: 15.9 10*3/uL — ABNORMAL HIGH (ref 1.7–7.7)
Neutrophils Relative %: 90 %
Platelets: 344 10*3/uL (ref 150–400)
RBC: 5.82 MIL/uL — ABNORMAL HIGH (ref 4.22–5.81)
RDW: 13.5 % (ref 11.5–15.5)
WBC: 17.6 10*3/uL — ABNORMAL HIGH (ref 4.0–10.5)
nRBC: 0 % (ref 0.0–0.2)

## 2021-04-05 LAB — COMPREHENSIVE METABOLIC PANEL
ALT: 36 U/L (ref 0–44)
AST: 30 U/L (ref 15–41)
Albumin: 5.1 g/dL — ABNORMAL HIGH (ref 3.5–5.0)
Alkaline Phosphatase: 115 U/L (ref 38–126)
Anion gap: 10 (ref 5–15)
BUN: 19 mg/dL (ref 6–20)
CO2: 23 mmol/L (ref 22–32)
Calcium: 9.9 mg/dL (ref 8.9–10.3)
Chloride: 104 mmol/L (ref 98–111)
Creatinine, Ser: 0.94 mg/dL (ref 0.61–1.24)
GFR, Estimated: >60 mL/min (ref >60)
Glucose, Bld: 146 mg/dL — ABNORMAL HIGH (ref 70–99)
Potassium: 4 mmol/L (ref 3.5–5.1)
Sodium: 137 mmol/L (ref 135–145)
Total Bilirubin: 1.4 mg/dL — ABNORMAL HIGH (ref 0.3–1.2)
Total Protein: 9.1 g/dL — ABNORMAL HIGH (ref 6.5–8.1)

## 2021-04-05 LAB — LIPASE, BLOOD: Lipase: 168 U/L — ABNORMAL HIGH (ref 11–51)

## 2021-04-05 LAB — TROPONIN I (HIGH SENSITIVITY): Troponin I (High Sensitivity): 3 ng/L (ref <18)

## 2021-04-05 MED ORDER — IOHEXOL 300 MG/ML  SOLN
100.0000 mL | Freq: Once | INTRAMUSCULAR | Status: AC | PRN
  Administered 2021-04-05: 100 mL via INTRAVENOUS

## 2021-04-05 MED ORDER — SODIUM CHLORIDE 0.9 % IV BOLUS
1000.0000 mL | Freq: Once | INTRAVENOUS | Status: AC
  Administered 2021-04-05: 1000 mL via INTRAVENOUS

## 2021-04-05 MED ORDER — SODIUM CHLORIDE 0.9 % IV BOLUS
500.0000 mL | Freq: Once | INTRAVENOUS | Status: AC
  Administered 2021-04-05: 500 mL via INTRAVENOUS

## 2021-04-05 MED ORDER — ONDANSETRON HCL 4 MG/2ML IJ SOLN
4.0000 mg | Freq: Once | INTRAMUSCULAR | Status: AC
  Administered 2021-04-05: 4 mg via INTRAVENOUS
  Filled 2021-04-05: qty 2

## 2021-04-05 NOTE — ED Triage Notes (Signed)
First Nurse Note:  Arrives c/o abdominal cramping.  Patient states took 3 tabs of Bentyl today at 1500, and cramping started after that.

## 2021-04-05 NOTE — ED Provider Triage Note (Signed)
°  Emergency Medicine Provider Triage Evaluation Note  Dakota Oliver , a 30 y.o.male,  was evaluated in triage.  Pt complains of abdominal pain and vomiting.  Patient states that he woke up with severe abdominal/back pain as well as intractable vomiting.  Additionally reports chest pain and pain in his back, shoulders, neck, calf, thigh cramping.  Reports taking Bentyl earlier today with some relief.  Nonbloody emesis.  Denies fever/chills, urinary symptoms, or headaches   Review of Systems  Positive: Abdominal pain, vomiting, chest pain Negative: Denies fever, urinary symptoms  Physical Exam  There were no vitals filed for this visit. Gen:   Awake, distressed Resp:  Labored breathing MSK:   Moves extremities without difficulty  Other:    Medical Decision Making  Given the patient's initial medical screening exam, the following diagnostic evaluation has been ordered. The patient will be placed in the appropriate treatment space, once one is available, to complete the evaluation and treatment. I have discussed the plan of care with the patient and I have advised the patient that an ED physician or mid-level practitioner will reevaluate their condition after the test results have been received, as the results may give them additional insight into the type of treatment they may need.    Diagnostics: Labs, EKG, CXR, abdominal CT  Treatments: IV fluids, ondansetron.   Varney Daily, [place] 04/05/21 1751

## 2021-04-05 NOTE — ED Notes (Signed)
Pt ambulated off unit AMA  

## 2021-04-05 NOTE — ED Triage Notes (Signed)
Pt to ER with complaints of back, shoulders, necks, calves, and thigh cramping. Also complaints of abdominal and chest pain. Also reports multiple episodes of emesis.   Reports the pains started around noon today, intermittent and sharp. Reports taking x3 20mg  bentyl with some relief in symptoms PTA.

## 2021-04-05 NOTE — ED Notes (Signed)
Pt requests IV be removed

## 2021-04-21 ENCOUNTER — Emergency Department
Admission: EM | Admit: 2021-04-21 | Discharge: 2021-04-21 | Disposition: A | Discharge status: Home / Self Care | Payer: Medicaid Other | Attending: Emergency Medicine | Admitting: Emergency Medicine

## 2021-04-21 ENCOUNTER — Other Ambulatory Visit: Payer: Self-pay

## 2021-04-21 DIAGNOSIS — F411 Generalized anxiety disorder: Secondary | ICD-10-CM

## 2021-04-21 DIAGNOSIS — F419 Anxiety disorder, unspecified: Secondary | ICD-10-CM | POA: Insufficient documentation

## 2021-04-21 DIAGNOSIS — E876 Hypokalemia: Secondary | ICD-10-CM | POA: Diagnosis not present

## 2021-04-21 DIAGNOSIS — F19230 Other psychoactive substance dependence with withdrawal, uncomplicated: Secondary | ICD-10-CM | POA: Insufficient documentation

## 2021-04-21 DIAGNOSIS — F13930 Sedative, hypnotic or anxiolytic use, unspecified with withdrawal, uncomplicated: Secondary | ICD-10-CM

## 2021-04-21 DIAGNOSIS — R000 Tachycardia, unspecified: Secondary | ICD-10-CM | POA: Diagnosis not present

## 2021-04-21 LAB — COMPREHENSIVE METABOLIC PANEL
ALT: 19 U/L (ref 0–44)
AST: 31 U/L (ref 15–41)
Albumin: 4.6 g/dL (ref 3.5–5.0)
Alkaline Phosphatase: 90 U/L (ref 38–126)
Anion gap: 12 (ref 5–15)
BUN: 12 mg/dL (ref 6–20)
CO2: 21 mmol/L — ABNORMAL LOW (ref 22–32)
Calcium: 9.2 mg/dL (ref 8.9–10.3)
Chloride: 104 mmol/L (ref 98–111)
Creatinine, Ser: 0.83 mg/dL (ref 0.61–1.24)
GFR, Estimated: >60 mL/min (ref >60)
Glucose, Bld: 116 mg/dL — ABNORMAL HIGH (ref 70–99)
Potassium: 3.1 mmol/L — ABNORMAL LOW (ref 3.5–5.1)
Sodium: 137 mmol/L (ref 135–145)
Total Bilirubin: 0.8 mg/dL (ref 0.3–1.2)
Total Protein: 7.6 g/dL (ref 6.5–8.1)

## 2021-04-21 LAB — ACETAMINOPHEN LEVEL: Acetaminophen (Tylenol), Serum: <10 ug/mL — ABNORMAL LOW (ref 10–30)

## 2021-04-21 LAB — CBC
HCT: 39.3 % (ref 39.0–52.0)
Hemoglobin: 14.2 g/dL (ref 13.0–17.0)
MCH: 31.1 pg (ref 26.0–34.0)
MCHC: 36.1 g/dL — ABNORMAL HIGH (ref 30.0–36.0)
MCV: 86.2 fL (ref 80.0–100.0)
Platelets: 348 10*3/uL (ref 150–400)
RBC: 4.56 MIL/uL (ref 4.22–5.81)
RDW: 12.6 % (ref 11.5–15.5)
WBC: 10.4 10*3/uL (ref 4.0–10.5)
nRBC: 0 % (ref 0.0–0.2)

## 2021-04-21 LAB — URINE DRUG SCREEN, QUALITATIVE (ARMC ONLY)
Amphetamines, Ur Screen: POSITIVE — AB
Barbiturates, Ur Screen: NOT DETECTED
Benzodiazepine, Ur Scrn: NOT DETECTED
Cannabinoid 50 Ng, Ur ~~LOC~~: POSITIVE — AB
Cocaine Metabolite,Ur ~~LOC~~: NOT DETECTED
MDMA (Ecstasy)Ur Screen: NOT DETECTED
Methadone Scn, Ur: NOT DETECTED
Opiate, Ur Screen: NOT DETECTED
Phencyclidine (PCP) Ur S: NOT DETECTED
Tricyclic, Ur Screen: POSITIVE — AB

## 2021-04-21 LAB — ETHANOL: Alcohol, Ethyl (B): <10 mg/dL (ref <10)

## 2021-04-21 LAB — SALICYLATE LEVEL: Salicylate Lvl: <7 mg/dL — ABNORMAL LOW (ref 7.0–30.0)

## 2021-04-21 LAB — D-DIMER, QUANTITATIVE: D-Dimer, Quant: 0.36 ug/mL-FEU (ref 0.00–0.50)

## 2021-04-21 MED ORDER — POTASSIUM CHLORIDE CRYS ER 20 MEQ PO TBCR
40.0000 meq | EXTENDED_RELEASE_TABLET | Freq: Once | ORAL | Status: AC
  Administered 2021-04-21: 40 meq via ORAL
  Filled 2021-04-21: qty 2

## 2021-04-21 MED ORDER — IBUPROFEN 600 MG PO TABS
600.0000 mg | ORAL_TABLET | ORAL | Status: DC
  Filled 2021-04-21: qty 1

## 2021-04-21 MED ORDER — DIAZEPAM 5 MG/ML IJ SOLN
5.0000 mg | Freq: Once | INTRAMUSCULAR | Status: AC
  Administered 2021-04-21: 5 mg via INTRAVENOUS
  Filled 2021-04-21: qty 2

## 2021-04-21 MED ORDER — ACETAMINOPHEN 325 MG PO TABS
650.0000 mg | ORAL_TABLET | Freq: Once | ORAL | Status: AC
  Administered 2021-04-21: 650 mg via ORAL
  Filled 2021-04-21: qty 2

## 2021-04-21 MED ORDER — DIAZEPAM 5 MG PO TABS: 5.0000 mg | ORAL_TABLET | Freq: Three times a day (TID) | ORAL | 0 refills | Status: AC | PRN

## 2021-04-21 MED ORDER — SODIUM CHLORIDE 0.9 % IV BOLUS
1000.0000 mL | Freq: Once | INTRAVENOUS | Status: AC
  Administered 2021-04-21: 1000 mL via INTRAVENOUS

## 2021-04-21 NOTE — ED Notes (Signed)
Pt given medication for headache as ordered.

## 2021-04-21 NOTE — ED Notes (Signed)
Pt states he is afraid of needles and will not allow lab draw at this time.

## 2021-04-21 NOTE — ED Notes (Signed)
EDP at bedside  

## 2021-04-21 NOTE — ED Provider Notes (Signed)
[facility] Provider Note    Event Date/Time   First MD Initiated Contact with Patient 04/21/21 1409     (approximate)   History   Anxiety   HPI  Dakota Oliver is a 31 y.o. male   reports a longstanding history of anxiety disorder depression  Patient reports that he started feeling very anxious today.  Has been taking Adderall for some time now has been helping his history of anxiety depression, he also had 1 beer and an energy drink.  He started notices felt like his heart was beating fast and felt very anxious.  He also reports that he typically has a prescription for Valium, but the prescription ran out 3 days ago.  He is also noticed that he did not sleep well last night he is feeling more anxious than usual.  Is here with his father.  No hallucinations.  No desire to harm himself or anyone else.  Patient continues see his primary care physician and is typically prescribed Adderall and Valium, takes about 3 Valium tablets daily but ran out a couple days ago.      Physical Exam   Triage Vital Signs: ED Triage Vitals  Enc Vitals Group     BP 04/21/21 1258 (!) 148/96     Pulse Rate 04/21/21 1258 (!) 121     Resp 04/21/21 1258 18     Temp 04/21/21 1258 98 F (36.7 C)     Temp src --      SpO2 04/21/21 1258 100 %     Weight --      Height --      Head Circumference --      Peak Flow --      Pain Score 04/21/21 1303 3     Pain Loc --      Pain Edu? --      Excl. in GC? --     Most recent vital signs: Vitals:   04/21/21 1315 04/21/21 1609  BP: (!) 145/88 129/88  Pulse: (!) 109 (!) 101  Resp: 17 17  Temp:  98.1 F (36.7 C)  SpO2: 100% 100%     General: Awake, no distress.  Does seem rather anxious.  Requesting that he needs to get some medication to help him calm down.  Does suffer from "panic attacks" and feels like he is having 1 today CV:  Good peripheral perfusion.  Slightly tachycardic.  Normal rate and  rhythm. Resp:  Normal effort.  Clear lung sounds speaks in clear sentences. Abd:  No distention.  No abdominal pain to examination of all quadrant Other:  Normal skin color. Psychiatric: Reports anxiety, he is however fully alert and oriented, does appear anxious slightly tremulous in his hands bilaterally.  No suicidal or homicidal ideations.  Denies hallucinations   ED Results / Procedures / Treatments   Labs (all labs ordered are listed, but only abnormal results are displayed) Labs Reviewed  COMPREHENSIVE METABOLIC PANEL - Abnormal; Notable for the following components:      Result Value   Potassium 3.1 (*)    CO2 21 (*)    Glucose, Bld 116 (*)    All other components within normal limits  SALICYLATE LEVEL - Abnormal; Notable for the following components:   Salicylate Lvl <7.0 (*)    All other components within normal limits  ACETAMINOPHEN LEVEL - Abnormal; Notable for the following components:   Acetaminophen (Tylenol), Serum <10 (*)    All other components  within normal limits  CBC - Abnormal; Notable for the following components:   MCHC 36.1 (*)    All other components within normal limits  URINE DRUG SCREEN, QUALITATIVE ([facility] ONLY) - Abnormal; Notable for the following components:   Tricyclic, Ur Screen POSITIVE (*)    Amphetamines, Ur Screen POSITIVE (*)    Cannabinoid 50 Ng, Ur [place] POSITIVE (*)    All other components within normal limits  ETHANOL  D-DIMER, QUANTITATIVE     EKG  Reviewed interpret by me at 1305 9 heart rate 129 QRS 99 QTc 500 Sinus tachycardia   RADIOLOGY     PROCEDURES:  Critical Care performed: No  Procedures   MEDICATIONS ORDERED IN ED: Medications  diazepam (VALIUM) injection 5 mg (5 mg Intravenous Given 04/21/21 1443)  sodium chloride 0.9 % bolus 1,000 mL (1,000 mLs Intravenous New Bag/Given 04/21/21 1438)  potassium chloride SA (KLOR-CON M) CR tablet 40 mEq (40 mEq Oral Given 04/21/21 1450)  acetaminophen (TYLENOL) tablet 650 mg  (650 mg Oral Given 04/21/21 1552)     IMPRESSION / MDM / ASSESSMENT AND PLAN / ED COURSE  I reviewed the triage vital signs and the nursing notes.                              Differential diagnosis includes, but is not limited to, benzodiazepine withdrawal, anxiety state, polysubstance abuse, pulmonary embolism or ACS though these seem highly unlikely, infectious etiologies toxic metabolic etiologies.  Patient denies any acute psychiatric symptoms except for high anxiety state, poor sleep and reports he ran out of his Valium about 2 to 3 days ago stopping quite cold [place] when his symptoms started to escalate within a day now not sleeping well.  Review his history medical history his prescriber database seems to me that he has been on relatively steady dose of Valium 5 mg taking up to 90 tablets a month, now suddenly stopped cold [place].  Suspect this is likely what precipitated his symptoms and his symptoms tachycardia anxiety state seem consistent with an acute benzodiazepine withdrawal    Independently reviewed the patient's labs, notable for mild hypokalemia.  D-dimer is negative, Wells criteria negative for risk factor for pulmonary embolism.  ----------------------------------------- 4:29 PM on 04/21/2021 ----------------------------------------- Patient feels much improved resting comfortably conversant.  Heart rate improved and he is resting comfortably.  Discussed with him and his father, comfortable the plan to call his doctor tomorrow he is interested in tapering off of his Valium potentially and will call his primary prescriber tomorrow morning.  We will give him a very short course 3 additional tablets to help him stave off withdrawal symptoms until he contact his PCP.  Discussed this with him and also discussed careful return precautions with he and his father.  Patient is not driving.  Return precautions and treatment recommendations and follow-up discussed with the patient  who is agreeable with the plan.        FINAL CLINICAL IMPRESSION(S) / ED DIAGNOSES   Final diagnoses:  Valium withdrawal without complication (HCC)  Anxiety state     Rx / DC Orders   ED Discharge Orders          Ordered    diazepam (VALIUM) 5 MG tablet  Every 8 hours PRN        04/21/21 1627             Note:  This document was prepared using Dragon  voice recognition software and may include unintentional dictation errors.   Sharyn CreamerQuale, Aleesa Sweigert, MD 04/21/21 1630

## 2021-04-21 NOTE — ED Notes (Signed)
Pt discharged home with father.  VS stable.

## 2021-04-21 NOTE — ED Notes (Signed)
Pt states he typically takes Valium, 5mg  TID.  Pt cannot get his prescription filled until tomorrow.  NT is helping patient control his breathing. VS have improved.

## 2021-04-21 NOTE — ED Triage Notes (Signed)
Pt comes with c/o possible anxiety/panic attack. Pt states he took his Adderall, 1 beer and a energy drink. Pt states now he feels sob, has cp and feels like his heart is racing.  Pt answering questions appropriately.  Pt states marijuana use today as well.

## 2021-06-16 DIAGNOSIS — T50905A Adverse effect of unspecified drugs, medicaments and biological substances, initial encounter: Secondary | ICD-10-CM | POA: Diagnosis not present

## 2021-06-16 DIAGNOSIS — R079 Chest pain, unspecified: Secondary | ICD-10-CM | POA: Diagnosis not present

## 2021-06-16 DIAGNOSIS — R000 Tachycardia, unspecified: Secondary | ICD-10-CM | POA: Diagnosis not present

## 2021-06-16 DIAGNOSIS — T4395XA Adverse effect of unspecified psychotropic drug, initial encounter: Secondary | ICD-10-CM | POA: Diagnosis not present

## 2021-06-19 DIAGNOSIS — R110 Nausea: Secondary | ICD-10-CM | POA: Diagnosis not present

## 2021-06-19 DIAGNOSIS — F331 Major depressive disorder, recurrent, moderate: Secondary | ICD-10-CM | POA: Diagnosis not present

## 2021-06-19 DIAGNOSIS — M1990 Unspecified osteoarthritis, unspecified site: Secondary | ICD-10-CM | POA: Diagnosis not present

## 2021-06-19 DIAGNOSIS — F909 Attention-deficit hyperactivity disorder, unspecified type: Secondary | ICD-10-CM | POA: Diagnosis not present

## 2021-06-19 DIAGNOSIS — I10 Essential (primary) hypertension: Secondary | ICD-10-CM | POA: Diagnosis not present

## 2021-06-19 DIAGNOSIS — E291 Testicular hypofunction: Secondary | ICD-10-CM | POA: Diagnosis not present

## 2021-06-19 DIAGNOSIS — E669 Obesity, unspecified: Secondary | ICD-10-CM | POA: Diagnosis not present

## 2021-06-19 DIAGNOSIS — K589 Irritable bowel syndrome without diarrhea: Secondary | ICD-10-CM | POA: Diagnosis not present

## 2021-06-19 DIAGNOSIS — F341 Dysthymic disorder: Secondary | ICD-10-CM | POA: Diagnosis not present

## 2021-06-19 DIAGNOSIS — G894 Chronic pain syndrome: Secondary | ICD-10-CM | POA: Diagnosis not present

## 2021-07-25 ENCOUNTER — Emergency Department
Admission: EM | Admit: 2021-07-25 | Discharge: 2021-07-25 | Disposition: A | Discharge status: Home / Self Care | Payer: Medicaid Other | Attending: Emergency Medicine | Admitting: Emergency Medicine

## 2021-07-25 ENCOUNTER — Other Ambulatory Visit: Payer: Self-pay

## 2021-07-25 DIAGNOSIS — F419 Anxiety disorder, unspecified: Secondary | ICD-10-CM | POA: Diagnosis not present

## 2021-07-25 DIAGNOSIS — I509 Heart failure, unspecified: Secondary | ICD-10-CM | POA: Diagnosis not present

## 2021-07-25 DIAGNOSIS — R457 State of emotional shock and stress, unspecified: Secondary | ICD-10-CM | POA: Diagnosis not present

## 2021-07-25 DIAGNOSIS — I110 Hypertensive heart disease with heart failure: Secondary | ICD-10-CM | POA: Insufficient documentation

## 2021-07-25 DIAGNOSIS — F410 Panic disorder [episodic paroxysmal anxiety] without agoraphobia: Secondary | ICD-10-CM

## 2021-07-25 LAB — CBC WITH DIFFERENTIAL/PLATELET
Abs Immature Granulocytes: 0.02 10*3/uL (ref 0.00–0.07)
Basophils Absolute: 0.1 10*3/uL (ref 0.0–0.1)
Basophils Relative: 1 %
Eosinophils Absolute: 0 10*3/uL (ref 0.0–0.5)
Eosinophils Relative: 1 %
HCT: 39.3 % (ref 39.0–52.0)
Hemoglobin: 13.7 g/dL (ref 13.0–17.0)
Immature Granulocytes: 0 %
Lymphocytes Relative: 20 %
Lymphs Abs: 1.4 10*3/uL (ref 0.7–4.0)
MCH: 30 pg (ref 26.0–34.0)
MCHC: 34.9 g/dL (ref 30.0–36.0)
MCV: 86 fL (ref 80.0–100.0)
Monocytes Absolute: 0.4 10*3/uL (ref 0.1–1.0)
Monocytes Relative: 6 %
Neutro Abs: 4.8 10*3/uL (ref 1.7–7.7)
Neutrophils Relative %: 72 %
Platelets: 372 10*3/uL (ref 150–400)
RBC: 4.57 MIL/uL (ref 4.22–5.81)
RDW: 12.4 % (ref 11.5–15.5)
WBC: 6.7 10*3/uL (ref 4.0–10.5)
nRBC: 0 % (ref 0.0–0.2)

## 2021-07-25 LAB — BASIC METABOLIC PANEL
Anion gap: 10 (ref 5–15)
BUN: 12 mg/dL (ref 6–20)
CO2: 24 mmol/L (ref 22–32)
Calcium: 9.2 mg/dL (ref 8.9–10.3)
Chloride: 103 mmol/L (ref 98–111)
Creatinine, Ser: 0.78 mg/dL (ref 0.61–1.24)
GFR, Estimated: >60 mL/min (ref >60)
Glucose, Bld: 98 mg/dL (ref 70–99)
Potassium: 2.8 mmol/L — ABNORMAL LOW (ref 3.5–5.1)
Sodium: 137 mmol/L (ref 135–145)

## 2021-07-25 MED ORDER — LABETALOL HCL 5 MG/ML IV SOLN: 10.0000 mg | Freq: Once | INTRAVENOUS | Status: DC

## 2021-07-25 MED ORDER — PROPRANOLOL HCL 20 MG PO TABS: 20.0000 mg | ORAL_TABLET | ORAL | Status: DC
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
## 2021-07-26 ENCOUNTER — Emergency Department: Payer: Medicaid Other

## 2021-07-26 ENCOUNTER — Encounter: Payer: Self-pay | Admitting: Emergency Medicine

## 2021-07-26 ENCOUNTER — Other Ambulatory Visit: Payer: Self-pay

## 2021-07-26 DIAGNOSIS — F419 Anxiety disorder, unspecified: Secondary | ICD-10-CM | POA: Diagnosis not present

## 2021-07-26 DIAGNOSIS — R079 Chest pain, unspecified: Secondary | ICD-10-CM | POA: Diagnosis not present

## 2021-07-26 DIAGNOSIS — Z5321 Procedure and treatment not carried out due to patient leaving prior to being seen by health care provider: Secondary | ICD-10-CM | POA: Diagnosis not present

## 2021-07-26 DIAGNOSIS — R0789 Other chest pain: Secondary | ICD-10-CM | POA: Diagnosis not present

## 2021-07-26 LAB — CBC
HCT: 40.9 % (ref 39.0–52.0)
Hemoglobin: 14.2 g/dL (ref 13.0–17.0)
MCH: 30.1 pg (ref 26.0–34.0)
MCHC: 34.7 g/dL (ref 30.0–36.0)
MCV: 86.7 fL (ref 80.0–100.0)
Platelets: 349 10*3/uL (ref 150–400)
RBC: 4.72 MIL/uL (ref 4.22–5.81)
RDW: 12.5 % (ref 11.5–15.5)
WBC: 7.6 10*3/uL (ref 4.0–10.5)
nRBC: 0 % (ref 0.0–0.2)

## 2021-07-26 NOTE — ED Triage Notes (Signed)
Pt reports he started to feel dizzy around 10 pm and CP around 10:30. Pt describes pain like a "punch" in his chest. Pt talks in complete sentences no respiratory distress noted. Per EMS pt was seen for the same yesterday, history of anxiety takes zolof. Pt also inquired if he could talk to PSY. Pt talks in complete sentences no respiratory distress noted.  ?

## 2021-07-27 ENCOUNTER — Emergency Department
Admission: EM | Admit: 2021-07-27 | Discharge: 2021-07-27 | Discharge status: Against Medical Advice | Payer: Medicaid Other | Attending: Emergency Medicine | Admitting: Emergency Medicine

## 2021-07-27 LAB — BASIC METABOLIC PANEL
Anion gap: 9 (ref 5–15)
BUN: 11 mg/dL (ref 6–20)
CO2: 27 mmol/L (ref 22–32)
Calcium: 9.6 mg/dL (ref 8.9–10.3)
Chloride: 101 mmol/L (ref 98–111)
Creatinine, Ser: 0.85 mg/dL (ref 0.61–1.24)
GFR, Estimated: >60 mL/min (ref >60)
Glucose, Bld: 75 mg/dL (ref 70–99)
Potassium: 2.9 mmol/L — ABNORMAL LOW (ref 3.5–5.1)
Sodium: 137 mmol/L (ref 135–145)

## 2021-07-27 LAB — TROPONIN I (HIGH SENSITIVITY): Troponin I (High Sensitivity): <2 ng/L (ref <18)

## 2021-09-04 DIAGNOSIS — F331 Major depressive disorder, recurrent, moderate: Secondary | ICD-10-CM | POA: Diagnosis not present

## 2021-09-04 DIAGNOSIS — R110 Nausea: Secondary | ICD-10-CM | POA: Diagnosis not present

## 2021-09-04 DIAGNOSIS — F909 Attention-deficit hyperactivity disorder, unspecified type: Secondary | ICD-10-CM | POA: Diagnosis not present

## 2021-09-04 DIAGNOSIS — E291 Testicular hypofunction: Secondary | ICD-10-CM | POA: Diagnosis not present

## 2021-09-04 DIAGNOSIS — F341 Dysthymic disorder: Secondary | ICD-10-CM | POA: Diagnosis not present

## 2021-09-04 DIAGNOSIS — K589 Irritable bowel syndrome without diarrhea: Secondary | ICD-10-CM | POA: Diagnosis not present

## 2021-09-04 DIAGNOSIS — M1990 Unspecified osteoarthritis, unspecified site: Secondary | ICD-10-CM | POA: Diagnosis not present

## 2021-09-04 DIAGNOSIS — F319 Bipolar disorder, unspecified: Secondary | ICD-10-CM | POA: Diagnosis not present

## 2021-09-04 DIAGNOSIS — E669 Obesity, unspecified: Secondary | ICD-10-CM | POA: Diagnosis not present

## 2021-09-04 DIAGNOSIS — I10 Essential (primary) hypertension: Secondary | ICD-10-CM | POA: Diagnosis not present

## 2021-11-08 DIAGNOSIS — Z113 Encounter for screening for infections with a predominantly sexual mode of transmission: Secondary | ICD-10-CM | POA: Diagnosis not present

## 2021-11-08 DIAGNOSIS — N489 Disorder of penis, unspecified: Secondary | ICD-10-CM | POA: Diagnosis not present

## 2021-11-08 DIAGNOSIS — R1084 Generalized abdominal pain: Secondary | ICD-10-CM | POA: Diagnosis not present

## 2021-11-08 DIAGNOSIS — J029 Acute pharyngitis, unspecified: Secondary | ICD-10-CM | POA: Diagnosis not present

## 2021-11-11 DIAGNOSIS — R110 Nausea: Secondary | ICD-10-CM | POA: Diagnosis not present

## 2021-11-11 DIAGNOSIS — A55 Chlamydial lymphogranuloma (venereum): Secondary | ICD-10-CM | POA: Diagnosis not present

## 2021-11-11 DIAGNOSIS — N485 Ulcer of penis: Secondary | ICD-10-CM | POA: Diagnosis not present

## 2021-11-11 DIAGNOSIS — F909 Attention-deficit hyperactivity disorder, unspecified type: Secondary | ICD-10-CM | POA: Diagnosis not present

## 2021-11-11 DIAGNOSIS — M1990 Unspecified osteoarthritis, unspecified site: Secondary | ICD-10-CM | POA: Diagnosis not present

## 2021-11-11 DIAGNOSIS — E291 Testicular hypofunction: Secondary | ICD-10-CM | POA: Diagnosis not present

## 2021-11-11 DIAGNOSIS — I10 Essential (primary) hypertension: Secondary | ICD-10-CM | POA: Diagnosis not present

## 2021-11-11 DIAGNOSIS — K589 Irritable bowel syndrome without diarrhea: Secondary | ICD-10-CM | POA: Diagnosis not present

## 2021-11-11 DIAGNOSIS — F331 Major depressive disorder, recurrent, moderate: Secondary | ICD-10-CM | POA: Diagnosis not present

## 2021-11-11 DIAGNOSIS — F319 Bipolar disorder, unspecified: Secondary | ICD-10-CM | POA: Diagnosis not present

## 2021-11-11 DIAGNOSIS — F341 Dysthymic disorder: Secondary | ICD-10-CM | POA: Diagnosis not present

## 2021-11-11 DIAGNOSIS — E669 Obesity, unspecified: Secondary | ICD-10-CM | POA: Diagnosis not present

## 2021-11-24 IMAGING — DX DG FOOT COMPLETE 3+V*L*
3 series · 3 of 3 positions shown · non-contrast
Comparison: None.

CLINICAL DATA: Stepped on nail.

EXAM:
LEFT FOOT - COMPLETE 3+ VIEW

[foot ap]
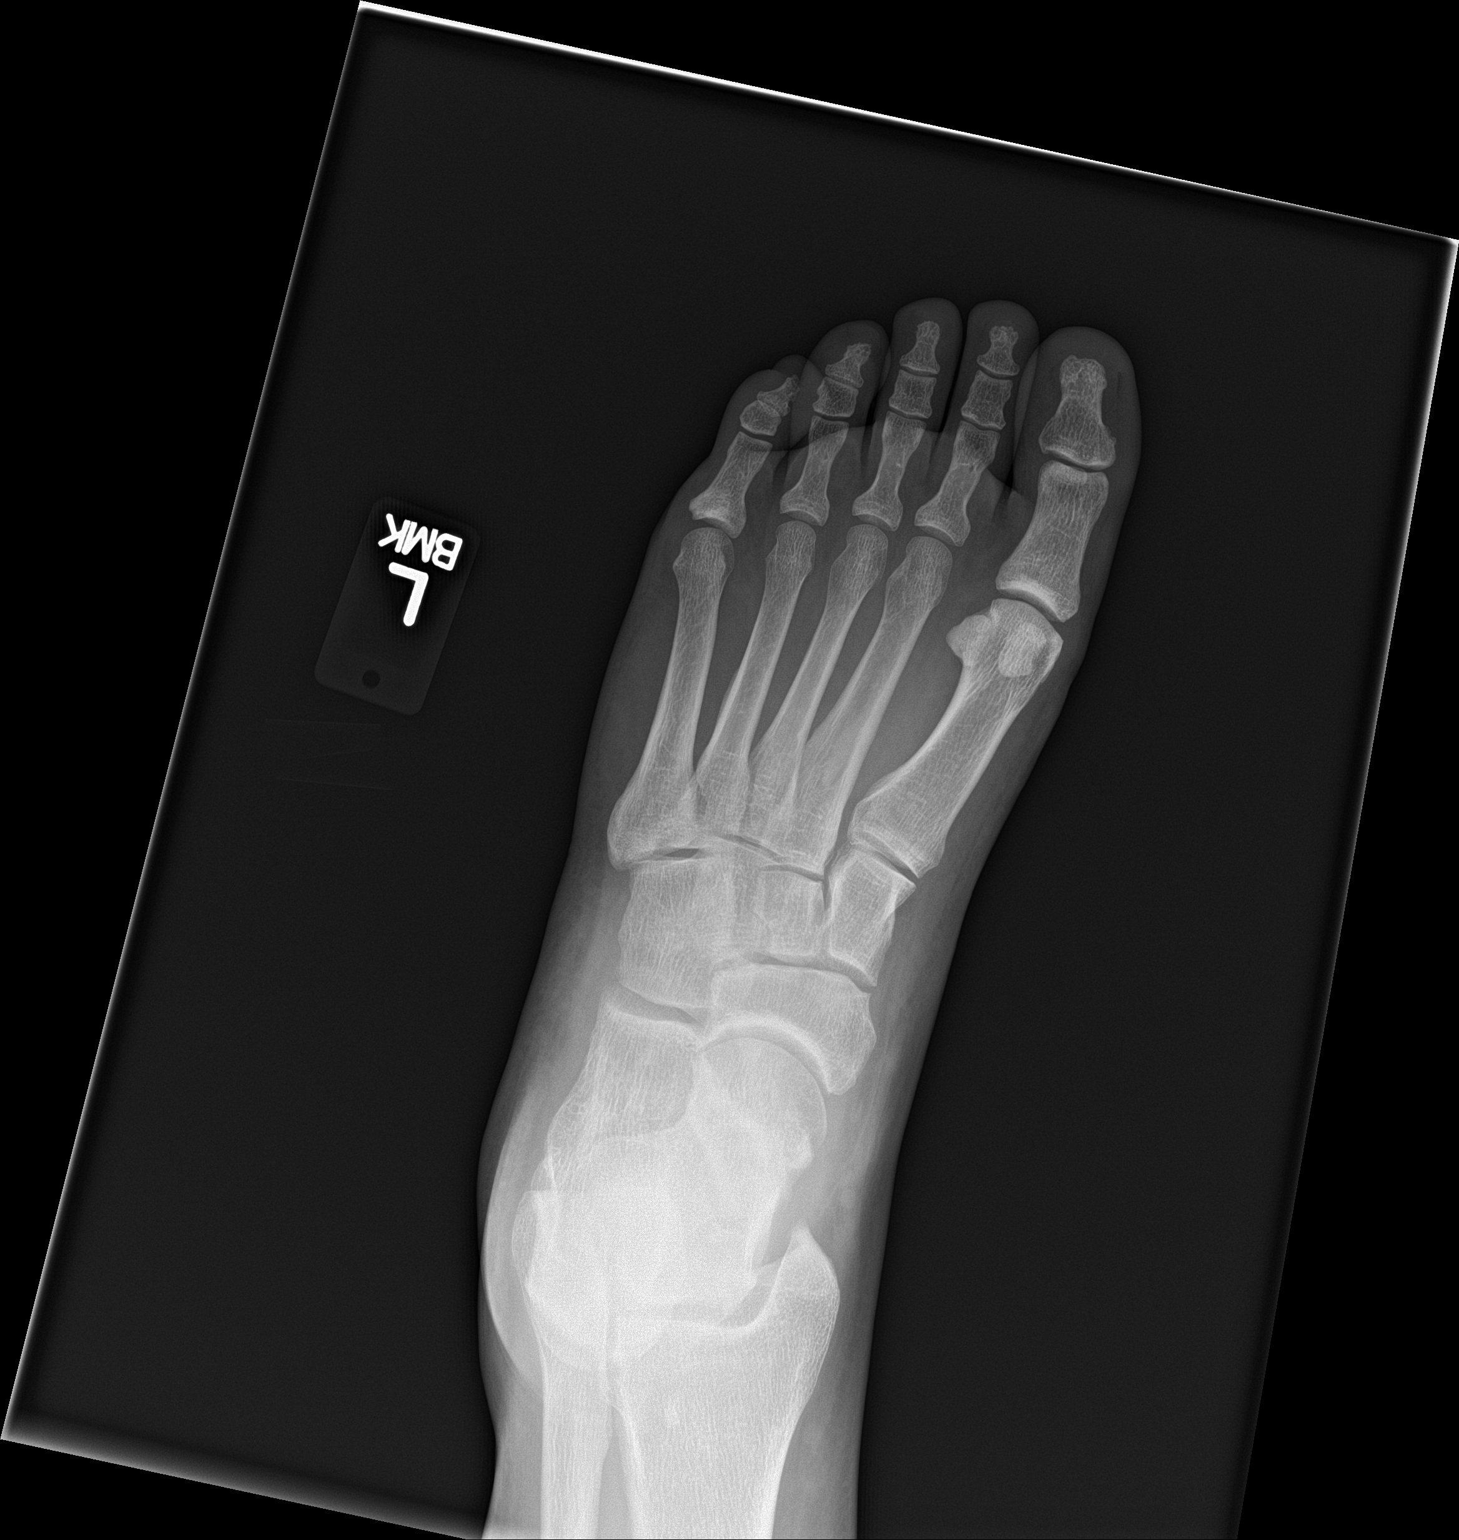

[foot obl]
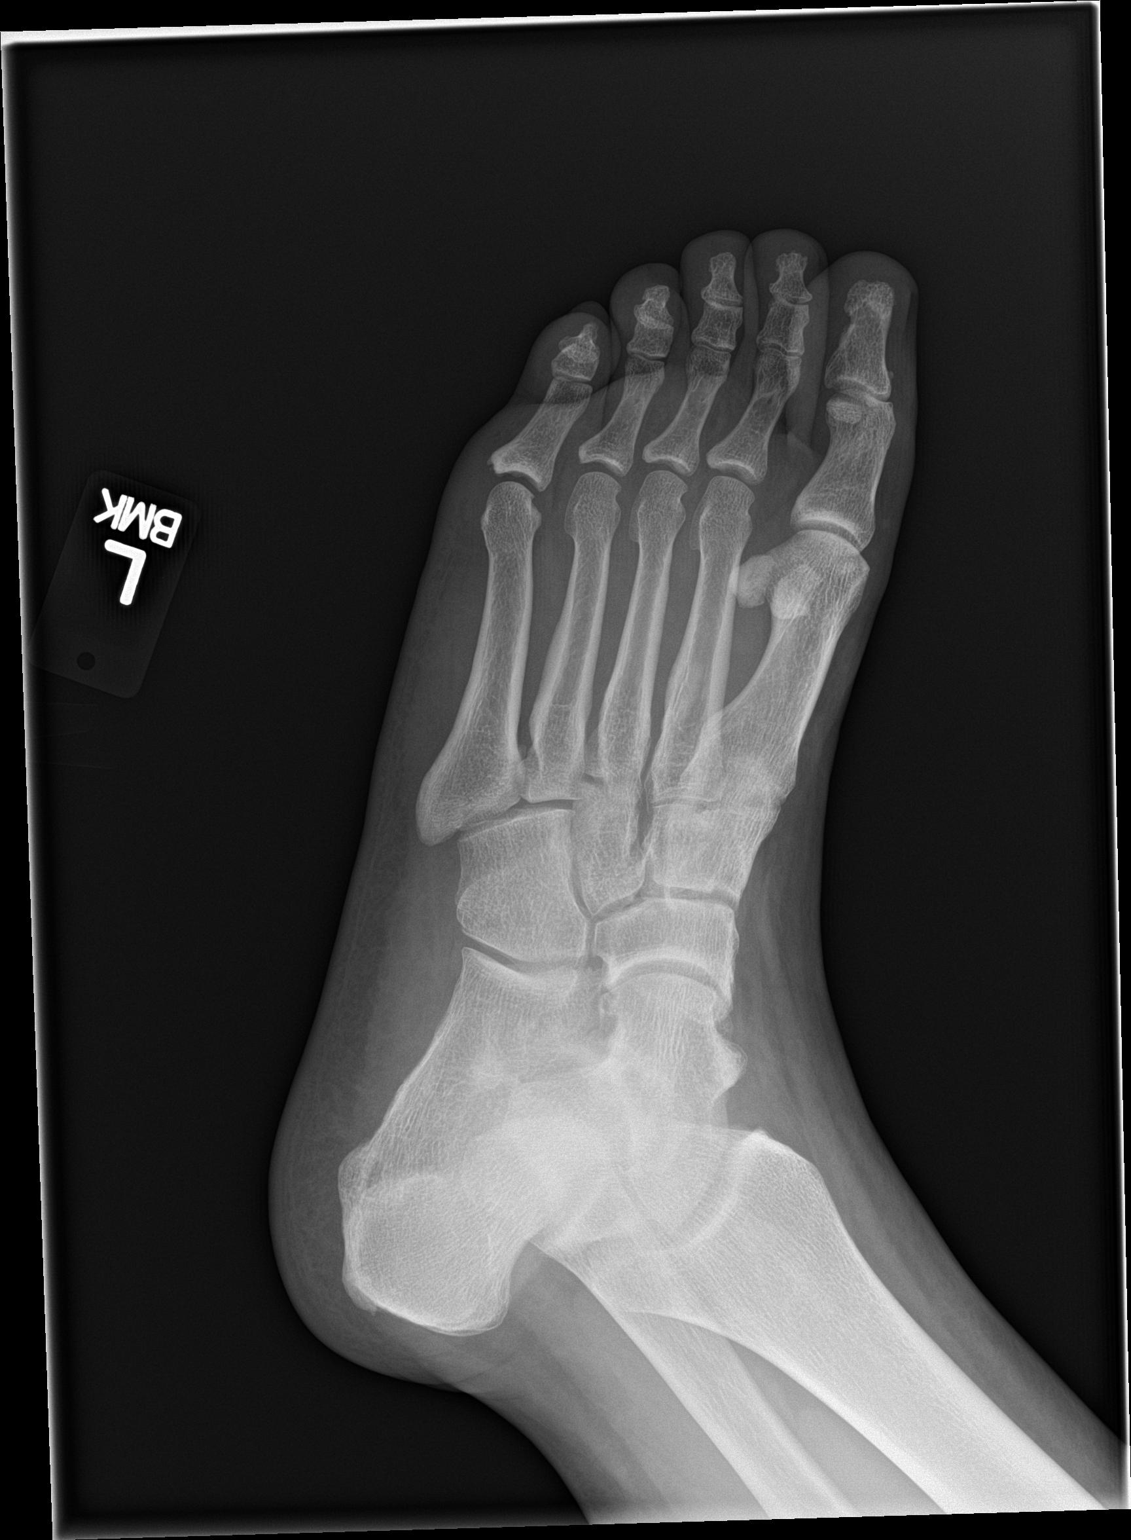

[foot lat]
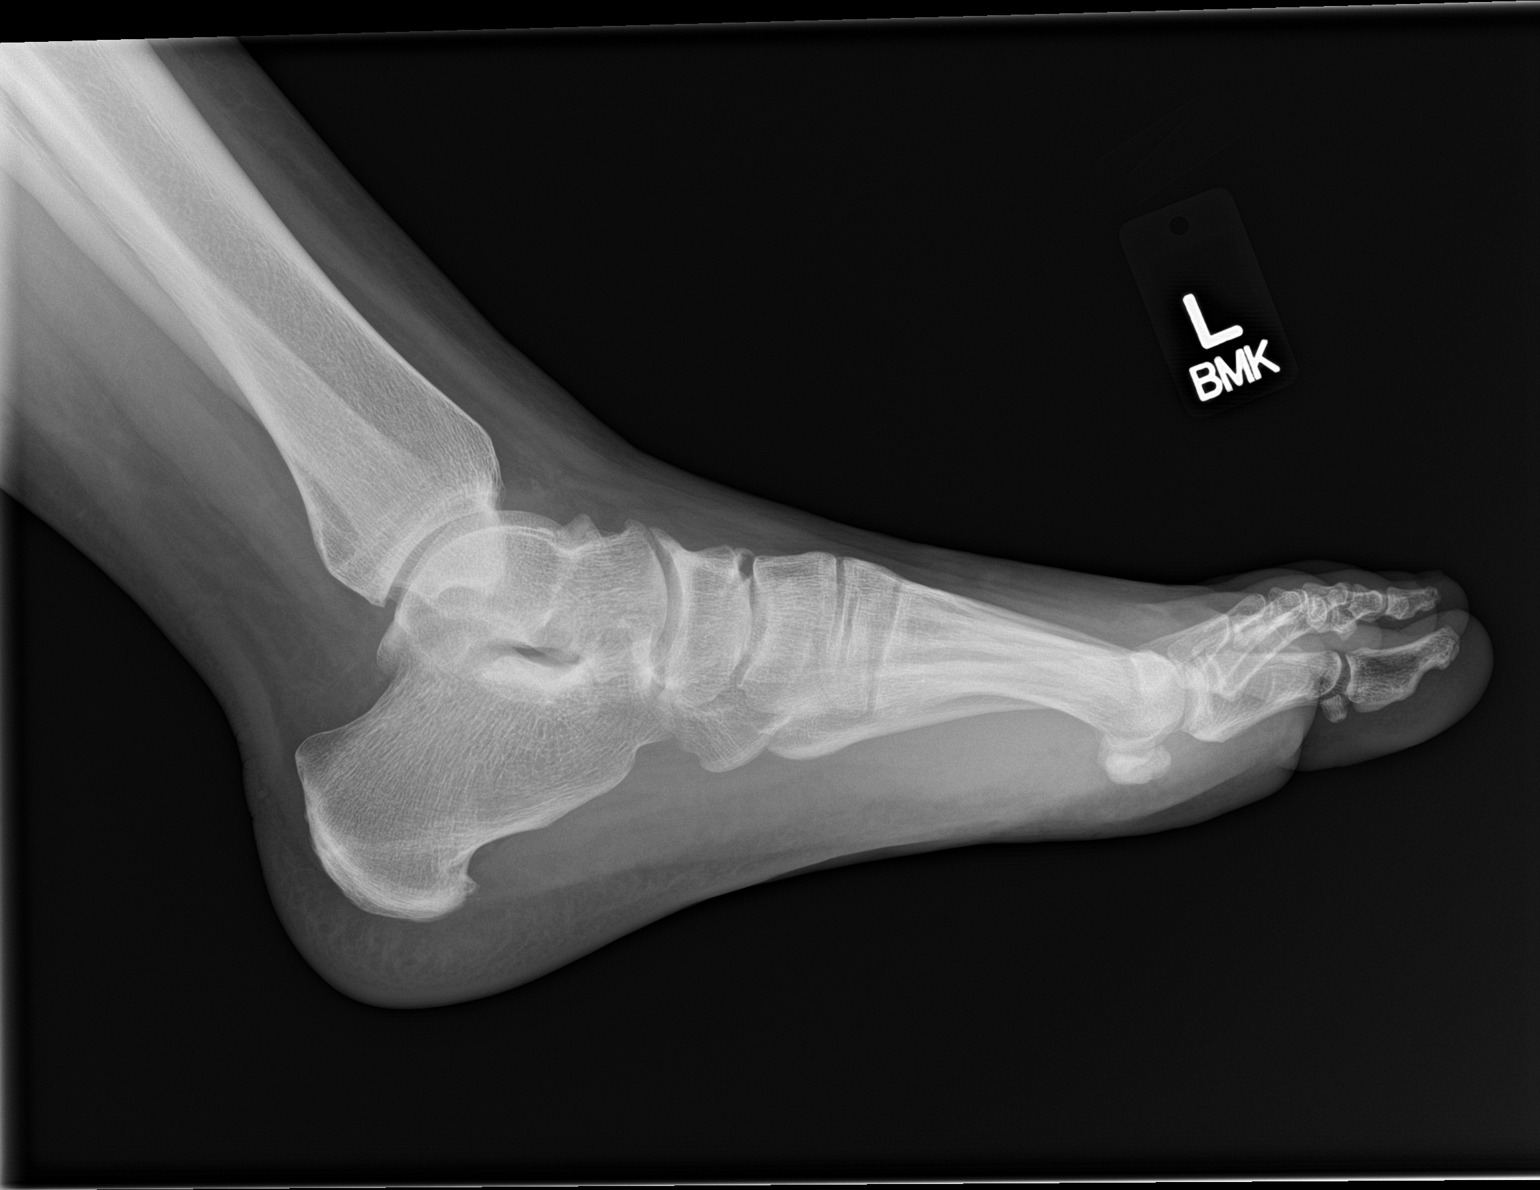

[3 of 3 positions shown; findings below may reference images not displayed]

FINDINGS: No acute bony abnormality. Specifically, no fracture, subluxation,
or dislocation. No radiopaque foreign body.
IMPRESSION: No fracture or foreign body

## 2022-02-17 DIAGNOSIS — R110 Nausea: Secondary | ICD-10-CM | POA: Diagnosis not present

## 2022-02-17 DIAGNOSIS — I10 Essential (primary) hypertension: Secondary | ICD-10-CM | POA: Diagnosis not present

## 2022-02-17 DIAGNOSIS — F319 Bipolar disorder, unspecified: Secondary | ICD-10-CM | POA: Diagnosis not present

## 2022-02-17 DIAGNOSIS — F909 Attention-deficit hyperactivity disorder, unspecified type: Secondary | ICD-10-CM | POA: Diagnosis not present

## 2022-02-17 DIAGNOSIS — F331 Major depressive disorder, recurrent, moderate: Secondary | ICD-10-CM | POA: Diagnosis not present

## 2022-02-17 DIAGNOSIS — M1990 Unspecified osteoarthritis, unspecified site: Secondary | ICD-10-CM | POA: Diagnosis not present

## 2022-02-17 DIAGNOSIS — E669 Obesity, unspecified: Secondary | ICD-10-CM | POA: Diagnosis not present

## 2022-02-17 DIAGNOSIS — F341 Dysthymic disorder: Secondary | ICD-10-CM | POA: Diagnosis not present

## 2022-02-17 DIAGNOSIS — E291 Testicular hypofunction: Secondary | ICD-10-CM | POA: Diagnosis not present

## 2022-02-17 DIAGNOSIS — K589 Irritable bowel syndrome without diarrhea: Secondary | ICD-10-CM | POA: Diagnosis not present

## 2022-05-02 ENCOUNTER — Emergency Department
Admission: EM | Admit: 2022-05-02 | Discharge: 2022-05-02 | Disposition: A | Discharge status: Home / Self Care | Payer: Medicaid Other | Attending: Emergency Medicine | Admitting: Emergency Medicine

## 2022-05-02 ENCOUNTER — Other Ambulatory Visit: Payer: Self-pay

## 2022-05-02 DIAGNOSIS — S6992XA Unspecified injury of left wrist, hand and finger(s), initial encounter: Secondary | ICD-10-CM | POA: Diagnosis present

## 2022-05-02 DIAGNOSIS — W260XXA Contact with knife, initial encounter: Secondary | ICD-10-CM | POA: Insufficient documentation

## 2022-05-02 DIAGNOSIS — S61012A Laceration without foreign body of left thumb without damage to nail, initial encounter: Secondary | ICD-10-CM

## 2022-05-02 MED ORDER — CEPHALEXIN 500 MG PO CAPS: 1000.0000 mg | ORAL_CAPSULE | Freq: Two times a day (BID) | ORAL | 0 refills | Status: AC

## 2022-05-02 MED ORDER — MELOXICAM 15 MG PO TABS: 15.0000 mg | ORAL_TABLET | Freq: Every day | ORAL | 0 refills | Status: AC

## 2022-05-02 NOTE — ED Provider Notes (Addendum)
[facility] Provider Note    Event Date/Time   First MD Initiated Contact with Patient 05/02/22 1521     (approximate)   History   Chief Complaint Laceration   HPI Dakota Oliver is a 32 y.o. male, history of depression, arthritis, presents to the emergency department for evaluation of finger laceration.  Patient states he was chopping wood with a knife when he sustained a laceration to his left thumb.  He states that he took Tylenol and ibuprofen for the pain.  Reports a significant amount of anxiety following the incident, he also self treated with his prescribed Valium as well.  He is currently up-to-date with his tetanus.  Denies head injury, wrist injury, forearm injury, fever/chills, paresthesias, cold sensation, or dizziness/lightheadedness.  History Limitations: No limitations.        Physical Exam  Triage Vital Signs: ED Triage Vitals [05/02/22 1424]  Enc Vitals Group     BP 125/88     Pulse Rate (!) 123     Resp 18     Temp 98 F (36.7 C)     Temp Source Oral     SpO2 99 %     Weight      Height      Head Circumference      Peak Flow      Pain Score 10     Pain Loc      Pain Edu?      Excl. in [place]?     Most recent vital signs: Vitals:   05/02/22 1424  BP: 125/88  Pulse: (!) 123  Resp: 18  Temp: 98 F (36.7 C)  SpO2: 99%    General: Awake, NAD.  Slightly lethargic, likely due to the recent self-medicating with Valium. Skin: Warm, dry. No rashes or lesions.  Eyes: PERRL. Conjunctivae normal.  CV: Good peripheral perfusion.  Resp: Normal effort.  Abd: Soft, non-tender. No distention.  Neuro: At baseline. No gross neurological deficits.  Musculoskeletal: Normal ROM of all extremities.  Focused Exam: 2 cm superficial laceration across the lateral aspect of the left thumb.  No involvement of the nail plate/nailbed.  No foreign bodies.  No underlying osseous tenderness.  Patient maintains normal range of motion of the  digit, including flexion and extension at the DIP and PIP joints.  No active bleeding or discharge.  Sensation intact.  Physical Exam    ED Results / Procedures / Treatments  Labs (all labs ordered are listed, but only abnormal results are displayed) Labs Reviewed - No data to display   EKG N/A.    RADIOLOGY  ED Provider Interpretation: N/A.  No results found.  PROCEDURES:  Critical Care performed: N/A.  Marland Kitchen.Laceration Repair  Date/Time: 05/02/2022 4:32 PM  Performed by: Teodoro Spray, PA Authorized by: Teodoro Spray, PA   Consent:    Consent obtained:  Verbal   Risks, benefits, and alternatives were discussed: yes     Risks discussed:  Infection, pain, vascular damage, retained foreign body, tendon damage, poor wound healing, nerve damage, need for additional repair and poor cosmetic result   Alternatives discussed:  No treatment Universal protocol:    Patient identity confirmed:  Verbally with patient Anesthesia:    Anesthesia method:  None Laceration details:    Location:  Finger   Finger location:  L thumb   Length (cm):  2   Depth (mm):  1 Pre-procedure details:    Preparation:  Patient was prepped and draped  in usual sterile fashion Exploration:    Hemostasis achieved with:  Direct pressure   Wound extent: fascia not violated, no foreign body, no signs of injury, no nerve damage, no tendon damage, no underlying fracture and no vascular damage     Contaminated: no   Treatment:    Irrigation solution:  Sterile saline   Irrigation volume:  1000 ml   Irrigation method:  Syringe   Debridement:  None   Undermining:  None Skin repair:    Repair method:  Tissue adhesive Approximation:    Approximation:  Close Repair type:    Repair type:  Simple Post-procedure details:    Dressing:  Adhesive bandage     MEDICATIONS ORDERED IN ED: Medications - No data to display   IMPRESSION / MDM / [place] / ED COURSE  I reviewed the  triage vital signs and the nursing notes.                              Differential diagnosis includes, but is not limited to, finger laceration, foreign body, cellulitis, extensor/flexor tendon injury, vascular injury.  Assessment/Plan Patient presents with 2 cm superficial linear laceration on the lateral aspect of the left thumb.  No signs of foreign body.  No signs of flexor/extensor tendon injuries.  No signs of major vascular injuries.  No indication for imaging at this time.  Cleanse the wound with saline thoroughly.  Also to repair the wound with Dermabond glue solution.  Patient tolerated procedure well.  Given that his hands were dirty at the time, we will provide him with a prescription for cephalexin to take for infection prevention.  Advised him to keep the wound covered throughout the day as it heals.  Provide him with a brief prescription for meloxicam for the pain.  Will discharge.  Given that he did appear to be somewhat under the influence following his reported self medicating with valium, he was discharged under the care of his wife.  Provided the patient with anticipatory guidance, return precautions, and educational material. Encouraged the patient to return to the emergency department at any time if they begin to experience any new or worsening symptoms. Patient expressed understanding and agreed with the plan.   Patient's presentation is most consistent with acute complicated illness / injury requiring diagnostic workup.       FINAL CLINICAL IMPRESSION(S) / ED DIAGNOSES   Final diagnoses:  Laceration of left thumb without foreign body without damage to nail, initial encounter     Rx / DC Orders   ED Discharge Orders          Ordered    meloxicam (MOBIC) 15 MG tablet  Daily        05/02/22 1621    cephALEXin (KEFLEX) 500 MG capsule  2 times daily        05/02/22 1621             Note:  This document was prepared using Dragon voice recognition software  and may include unintentional dictation errors.   Teodoro Spray, [place] 05/02/22 [place], [place], [place] 05/02/22 1636    Nance Pear, MD 05/02/22 [phone]

## 2022-05-02 NOTE — Discharge Instructions (Addendum)
-  The Dermabond glue will dissolve on its own in 5 to 7 days.  No further interventions are needed.  Do not apply any topical ointments, lotions, oils, or creams, as this will cause the glue to dissolve prematurely.  However, you may wash your hands with plain water.  -Please take the full course of the antibiotics as prescribed to prevent infection.  You may take the meloxicam as needed for pain.  -Return to the emergency department anytime if you begin to experience any new or worsening symptoms.

## 2022-05-02 NOTE — ED Triage Notes (Addendum)
Pt to ED via POV from home. Pt was chopping wood with a knife and sustained laceration to left thumb. Pt requesting IV pain medication/anti-anxiety prior to interventions done to thumb.

## 2022-05-10 ENCOUNTER — Emergency Department: Payer: Medicaid Other

## 2022-05-10 ENCOUNTER — Other Ambulatory Visit: Payer: Self-pay | Admitting: Internal Medicine

## 2022-05-10 ENCOUNTER — Encounter: Payer: Self-pay | Admitting: Internal Medicine

## 2022-05-10 ENCOUNTER — Emergency Department
Admission: EM | Admit: 2022-05-10 | Discharge: 2022-05-10 | Disposition: A | Discharge status: Home / Self Care | Payer: Medicaid Other | Attending: Emergency Medicine | Admitting: Emergency Medicine

## 2022-05-10 DIAGNOSIS — R509 Fever, unspecified: Secondary | ICD-10-CM | POA: Diagnosis not present

## 2022-05-10 DIAGNOSIS — R410 Disorientation, unspecified: Secondary | ICD-10-CM | POA: Diagnosis not present

## 2022-05-10 DIAGNOSIS — R000 Tachycardia, unspecified: Secondary | ICD-10-CM | POA: Diagnosis not present

## 2022-05-10 DIAGNOSIS — R569 Unspecified convulsions: Secondary | ICD-10-CM | POA: Insufficient documentation

## 2022-05-10 LAB — CBC WITH DIFFERENTIAL/PLATELET
Abs Immature Granulocytes: 0.06 10*3/uL (ref 0.00–0.07)
Basophils Absolute: 0 10*3/uL (ref 0.0–0.1)
Basophils Relative: 0 %
Eosinophils Absolute: 0 10*3/uL (ref 0.0–0.5)
Eosinophils Relative: 0 %
HCT: 41.6 % (ref 39.0–52.0)
Hemoglobin: 14.7 g/dL (ref 13.0–17.0)
Immature Granulocytes: 1 %
Lymphocytes Relative: 25 %
Lymphs Abs: 2.3 10*3/uL (ref 0.7–4.0)
MCH: 29.4 pg (ref 26.0–34.0)
MCHC: 35.3 g/dL (ref 30.0–36.0)
MCV: 83.2 fL (ref 80.0–100.0)
Monocytes Absolute: 0.6 10*3/uL (ref 0.1–1.0)
Monocytes Relative: 6 %
Neutro Abs: 6.2 10*3/uL (ref 1.7–7.7)
Neutrophils Relative %: 68 %
Platelets: 343 10*3/uL (ref 150–400)
RBC: 5 MIL/uL (ref 4.22–5.81)
RDW: 13.3 % (ref 11.5–15.5)
WBC: 9.2 10*3/uL (ref 4.0–10.5)
nRBC: 0 % (ref 0.0–0.2)

## 2022-05-10 LAB — ETHANOL: Alcohol, Ethyl (B): <10 mg/dL (ref <10)

## 2022-05-10 LAB — URINE DRUG SCREEN, QUALITATIVE (ARMC ONLY)
Amphetamines, Ur Screen: POSITIVE — AB
Barbiturates, Ur Screen: NOT DETECTED
Benzodiazepine, Ur Scrn: NOT DETECTED
Cannabinoid 50 Ng, Ur ~~LOC~~: POSITIVE — AB
Cocaine Metabolite,Ur ~~LOC~~: NOT DETECTED
MDMA (Ecstasy)Ur Screen: NOT DETECTED
Methadone Scn, Ur: NOT DETECTED
Opiate, Ur Screen: NOT DETECTED
Phencyclidine (PCP) Ur S: NOT DETECTED
Tricyclic, Ur Screen: NOT DETECTED

## 2022-05-10 LAB — BASIC METABOLIC PANEL
Anion gap: 12 (ref 5–15)
BUN: 12 mg/dL (ref 6–20)
CO2: 20 mmol/L — ABNORMAL LOW (ref 22–32)
Calcium: 9.7 mg/dL (ref 8.9–10.3)
Chloride: 105 mmol/L (ref 98–111)
Creatinine, Ser: 0.78 mg/dL (ref 0.61–1.24)
GFR, Estimated: >60 mL/min (ref >60)
Glucose, Bld: 100 mg/dL — ABNORMAL HIGH (ref 70–99)
Potassium: 3.8 mmol/L (ref 3.5–5.1)
Sodium: 137 mmol/L (ref 135–145)

## 2022-05-10 LAB — ACETAMINOPHEN LEVEL: Acetaminophen (Tylenol), Serum: <10 ug/mL — ABNORMAL LOW (ref 10–30)

## 2022-05-10 LAB — SALICYLATE LEVEL: Salicylate Lvl: <7 mg/dL — ABNORMAL LOW (ref 7.0–30.0)

## 2022-05-10 MED ORDER — LORAZEPAM 2 MG/ML IJ SOLN
2.0000 mg | Freq: Once | INTRAMUSCULAR | Status: AC
Start: 2022-05-10 — End: 2022-05-10
  Administered 2022-05-10: 2 mg via INTRAVENOUS
  Filled 2022-05-10: qty 1

## 2022-05-10 MED ORDER — SODIUM CHLORIDE 0.9 % IV BOLUS
1000.0000 mL | Freq: Once | INTRAVENOUS | Status: AC
  Administered 2022-05-10: 1000 mL via INTRAVENOUS

## 2022-05-10 MED ORDER — LORAZEPAM 1 MG PO TABS
1.0000 mg | ORAL_TABLET | Freq: Once | ORAL | Status: AC
  Administered 2022-05-10: 1 mg via ORAL
  Filled 2022-05-10: qty 1

## 2022-05-10 NOTE — ED Triage Notes (Signed)
Patient presents to the ER after a witnessed seizure. Patient has some confusion via EMS. Patient is awake and alert. Patient denies having history of seizures. Patient denies taking anything for seizures.

## 2022-05-10 NOTE — ED Provider Notes (Addendum)
[facility] Provider Note    Event Date/Time   First MD Initiated Contact with Patient 05/10/22 1553     (approximate)   History   Seizures   HPI  Dakota Oliver is a 32 y.o. male with a history of ADHD, methamphetamine use, and possible seizure disorder who presents after an apparent seizure witnessed by his wife.  EMS reported that the patient was confused and postictal although is improving.  The patient remembers being at home and then the next thing he remembers is EMS being there.  He states he is unaware of ever having had a seizure before.  However EMS reported to the RN that they had been called out to the patient's home multiple times in the past for seizures.  Currently the patient reports dry mouth but denies any headache, dizziness, or acute pain.  He states he takes 15 mg of Valium daily although possibly has not taken it in the last couple of days.  He uses methamphetamine and states he last used in the last day or 2.  He endorses marijuana use as well and states he drinks occasionally but denies heavy alcohol use or other drugs.  He is not on any seizure medications.  I reviewed the past medical records.  The patient has multiple prior ED visits over the last 2 years for a laceration, anxiety, and chest pain.  I do not see any prior ED visits for seizures.  His most recent documented outpatient encounter was in urgent care in August of last year for abdominal pain and lymphadenopathy.   Physical Exam   Triage Vital Signs: ED Triage Vitals  Enc Vitals Group     BP 05/10/22 1556 117/82     Pulse Rate 05/10/22 1556 (!) 118     Resp --      Temp 05/10/22 1600 98.6 F (37 C)     Temp src --      SpO2 05/10/22 1554 100 %     Weight 05/10/22 1603 160 lb (72.6 kg)     Height 05/10/22 1559 5' 7"$  (1.702 m)     Head Circumference --      Peak Flow --      Pain Score 05/10/22 1600 0     Pain Loc --      Pain Edu? --      Excl. in [place]? --      Most recent vital signs: Vitals:   05/10/22 1630 05/10/22 1700  BP: 119/75 123/77  Pulse: (!) 113 (!) 106  Resp: 14 18  Temp:    SpO2: 100% 99%     General: Alert, oriented x 2, anxious appearing. CV:  Tachycardic, regular rhythm.  Good peripheral perfusion.  Resp:  Normal effort.  Lungs CTAB. Abd:  No distention.  Other:  Pupils dilated.  PERRLA.  EOMI.  No facial droop.  Motor intact in all extremities.  No ataxia on finger-to-nose.  No pronator drift.   ED Results / Procedures / Treatments   Labs (all labs ordered are listed, but only abnormal results are displayed) Labs Reviewed  BASIC METABOLIC PANEL - Abnormal; Notable for the following components:      Result Value   CO2 20 (*)    Glucose, Bld 100 (*)    All other components within normal limits  ACETAMINOPHEN LEVEL - Abnormal; Notable for the following components:   Acetaminophen (Tylenol), Serum <10 (*)    All other components within normal limits  SALICYLATE LEVEL - Abnormal; Notable for the following components:   Salicylate Lvl [id number] (*)    All other components within normal limits  URINE DRUG SCREEN, QUALITATIVE ([facility] ONLY) - Abnormal; Notable for the following components:   Amphetamines, Ur Screen POSITIVE (*)    Cannabinoid 50 Ng, Ur [place] POSITIVE (*)    All other components within normal limits  CBC WITH DIFFERENTIAL/PLATELET  ETHANOL     EKG  ED ECG REPORT I, Arta Silence, the attending physician, personally viewed and interpreted this ECG.  Date: 05/10/2022 EKG Time: 1557 Rate: 119 Rhythm: Sinus tachycardia QRS Axis: normal Intervals: normal ST/T Wave abnormalities: normal Narrative Interpretation: no evidence of acute ischemia    RADIOLOGY  CT head: I independently viewed and interpreted the images; there is no ICH.  Radiology report indicates no acute abnormality.  PROCEDURES:  Critical Care performed: No  Procedures   MEDICATIONS ORDERED IN ED: Medications   LORazepam (ATIVAN) tablet 1 mg (has no administration in time range)  sodium chloride 0.9 % bolus 1,000 mL (0 mLs Intravenous Stopped 05/10/22 1827)  LORazepam (ATIVAN) injection 2 mg (2 mg Intravenous Given 05/10/22 1614)     IMPRESSION / MDM / ASSESSMENT AND PLAN / ED COURSE  I reviewed the triage vital signs and the nursing notes.  32 year old male with PMH as noted above including ADHD and questionable prior seizures presents after an apparent witnessed seizure.  Currently, the patient tachycardic with otherwise normal vital signs.  Neurologic exam is nonfocal.  The patient is somewhat anxious appearing.  He endorses recent methamphetamine use,  Differential diagnosis includes, but is not limited to, epileptic seizure, pseudoseizure, syncope with seizure-like activity, symptoms related to substance use.  I am somewhat concerned for possible benzodiazepine withdrawal given that he regularly is on Valium and states he did not use it in the last few days.  However the patient has no tremors or asterixis and is not hypertensive.  We will give IV fluids, Ativan, obtain lab workup, CT head, and reassess.    Patient's presentation is most consistent with acute presentation with potential threat to life or bodily function.  The patient is on the cardiac monitor to evaluate for evidence of arrhythmia and/or significant heart rate changes.  ----------------------------------------- 6:44 PM on 05/10/2022 -----------------------------------------  CT head shows no acute findings.  Lab workup shows UDS positive for amphetamines and cannabinoids consistent with patient's given history.  Ethanol is negative.  BMP is normal.  CBC is normal.  The patient's wife is now here and states that he has actually been off of the Valium for some time and this was not an acute change.  The patient did recently discontinue Vraylar due to an insurance discrepancy but this does not cause seizures or withdrawal  symptoms.  She confirms that the patient actually bit his tongue and describes an event consistent with generalized seizure.  Differential includes an epileptic seizure, versus possible seizure related to the amphetamines.  There is no evidence of benzodiazepine or other withdrawal.  On reassessment the patient is alert and oriented.  He is comfortable appearing and denies acute complaints.  His vital signs are normal except for borderline tachycardia.  At this time, there is no indication for further ED workup or treatment.  The patient is stable for discharge home.  I counseled him on the results of the workup.  I gave him strict return precautions and he expressed understanding.  I have also told him on not driving until he is  reevaluated.  I provided neurology referral and instructed him to see his PMD.   FINAL CLINICAL IMPRESSION(S) / ED DIAGNOSES   Final diagnoses:  Seizure ([place])     Rx / DC Orders   ED Discharge Orders     None        Note:  This document was prepared using Dragon voice recognition software and may include unintentional dictation errors.    Arta Silence, MD 05/10/22 Elberta Spaniel, MD 05/10/22 [phone]

## 2022-05-10 NOTE — Discharge Instructions (Signed)
You likely had a seizure.  This could be related to medication but may also be caused by epilepsy.  You should not drive until you are able to follow-up with your primary care doctor and/or a neurologist.  Neurology referral has been provided.  Continue taking your normal medications as prescribed.  Avoid any drugs or heavy alcohol use.  Return to the ER for new, worsening, or persistent episodes of passing out, seizures, convulsions, confusion, severe anxiety, or any other new or worsening symptoms that concern you.

## 2022-06-06 IMAGING — DX DG CHEST 1V
1 series · 1 of 1 positions shown · non-contrast
Comparison: 02/12/2021

CLINICAL DATA: Chest pain.

EXAM:
CHEST  1 VIEW

[chest ap]
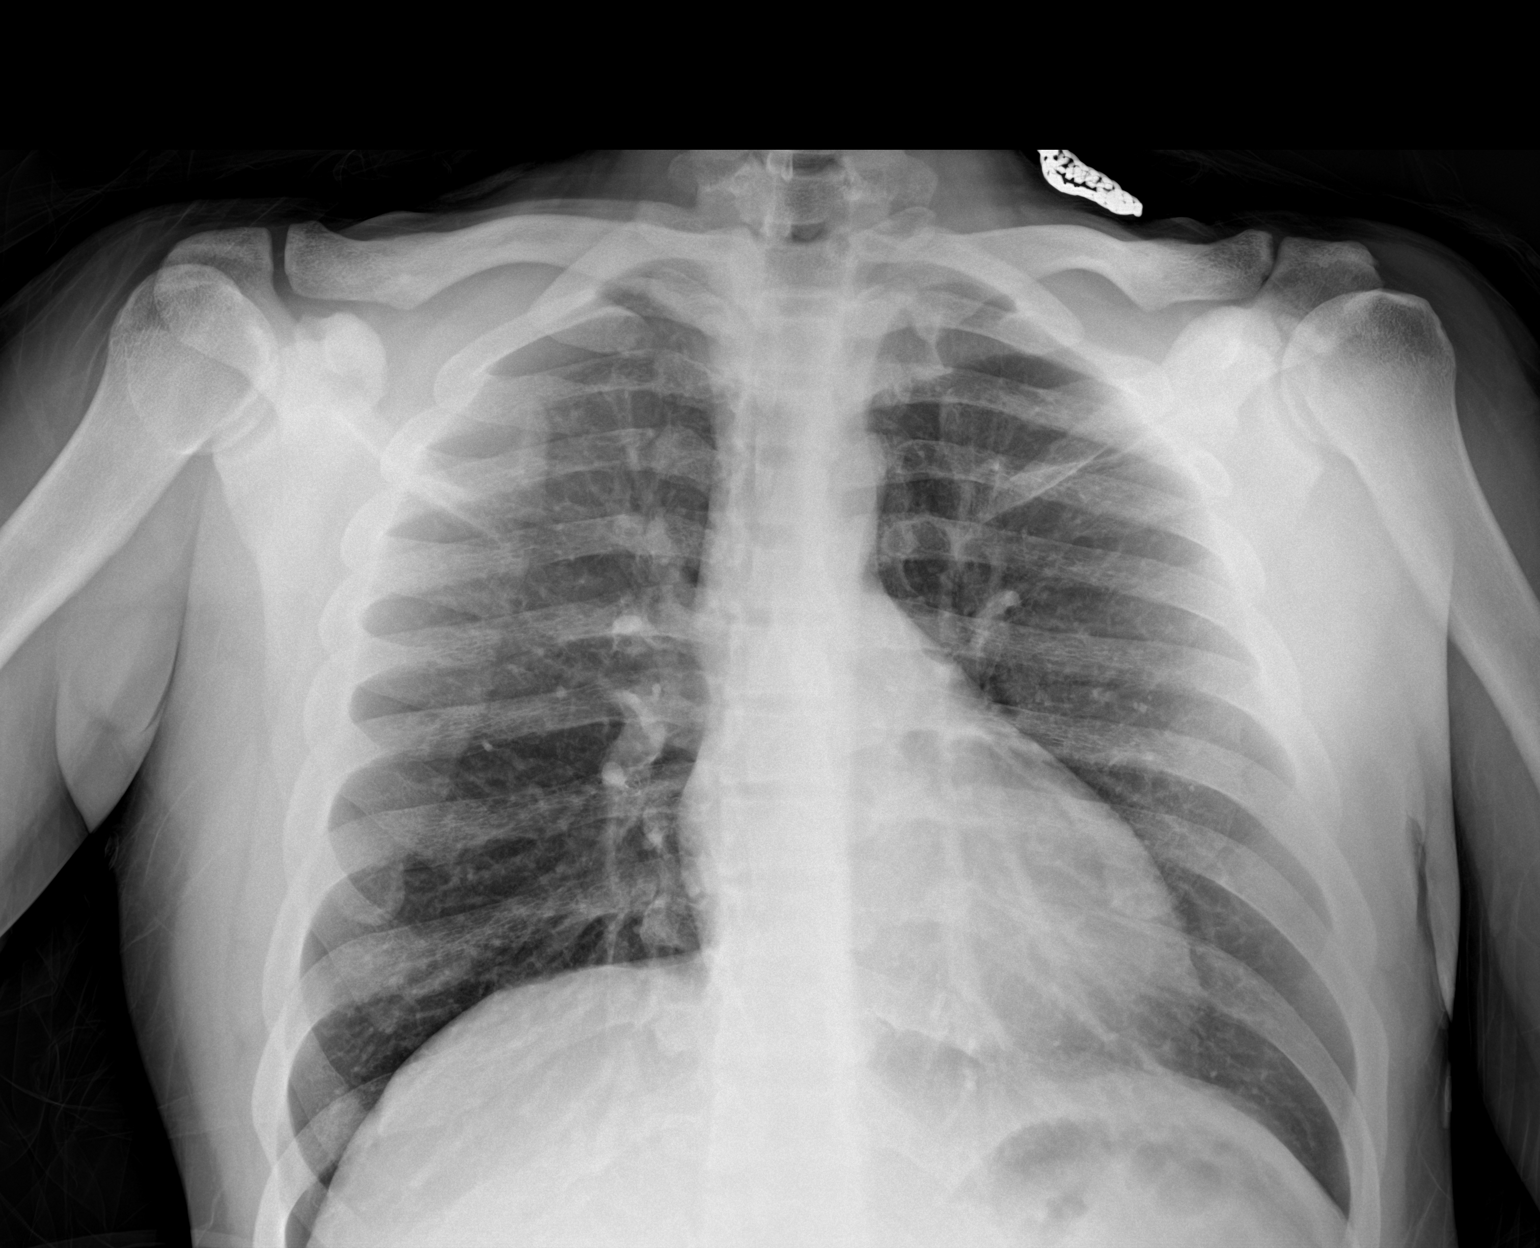

[1 of 1 positions shown; findings below may reference images not displayed]

FINDINGS: The cardiomediastinal contours are normal. The lungs are clear.
Pulmonary vasculature is normal. No consolidation, pleural effusion,
or pneumothorax. No acute osseous abnormalities are seen.
IMPRESSION: Negative AP view of the chest.

## 2022-07-02 ENCOUNTER — Emergency Department
Admission: EM | Admit: 2022-07-02 | Discharge: 2022-07-03 | Disposition: A | Discharge status: Home / Self Care | Payer: Medicaid Other | Attending: Emergency Medicine | Admitting: Emergency Medicine

## 2022-07-02 ENCOUNTER — Other Ambulatory Visit: Payer: Self-pay

## 2022-07-02 ENCOUNTER — Encounter: Payer: Self-pay | Admitting: *Deleted

## 2022-07-02 DIAGNOSIS — F331 Major depressive disorder, recurrent, moderate: Secondary | ICD-10-CM | POA: Diagnosis present

## 2022-07-02 DIAGNOSIS — F17210 Nicotine dependence, cigarettes, uncomplicated: Secondary | ICD-10-CM | POA: Insufficient documentation

## 2022-07-02 DIAGNOSIS — F32A Depression, unspecified: Secondary | ICD-10-CM

## 2022-07-02 DIAGNOSIS — F1910 Other psychoactive substance abuse, uncomplicated: Secondary | ICD-10-CM

## 2022-07-02 DIAGNOSIS — L03114 Cellulitis of left upper limb: Secondary | ICD-10-CM | POA: Diagnosis not present

## 2022-07-02 DIAGNOSIS — L03012 Cellulitis of left finger: Secondary | ICD-10-CM | POA: Diagnosis not present

## 2022-07-02 LAB — COMPREHENSIVE METABOLIC PANEL
ALT: 15 U/L (ref 0–44)
AST: 21 U/L (ref 15–41)
Albumin: 4.7 g/dL (ref 3.5–5.0)
Alkaline Phosphatase: 98 U/L (ref 38–126)
Anion gap: 11 (ref 5–15)
BUN: 11 mg/dL (ref 6–20)
CO2: 24 mmol/L (ref 22–32)
Calcium: 9.5 mg/dL (ref 8.9–10.3)
Chloride: 103 mmol/L (ref 98–111)
Creatinine, Ser: 0.79 mg/dL (ref 0.61–1.24)
GFR, Estimated: >60 mL/min (ref >60)
Glucose, Bld: 99 mg/dL (ref 70–99)
Potassium: 3.6 mmol/L (ref 3.5–5.1)
Sodium: 138 mmol/L (ref 135–145)
Total Bilirubin: 0.6 mg/dL (ref 0.3–1.2)
Total Protein: 8.4 g/dL — ABNORMAL HIGH (ref 6.5–8.1)

## 2022-07-02 LAB — CBC
HCT: 45.4 % (ref 39.0–52.0)
Hemoglobin: 15.5 g/dL (ref 13.0–17.0)
MCH: 29.8 pg (ref 26.0–34.0)
MCHC: 34.1 g/dL (ref 30.0–36.0)
MCV: 87.1 fL (ref 80.0–100.0)
Platelets: 382 10*3/uL (ref 150–400)
RBC: 5.21 MIL/uL (ref 4.22–5.81)
RDW: 13.2 % (ref 11.5–15.5)
WBC: 8.8 10*3/uL (ref 4.0–10.5)
nRBC: 0 % (ref 0.0–0.2)

## 2022-07-02 LAB — ACETAMINOPHEN LEVEL: Acetaminophen (Tylenol), Serum: <10 ug/mL — ABNORMAL LOW (ref 10–30)

## 2022-07-02 LAB — ETHANOL: Alcohol, Ethyl (B): <10 mg/dL (ref <10)

## 2022-07-02 LAB — SALICYLATE LEVEL: Salicylate Lvl: <7 mg/dL — ABNORMAL LOW (ref 7.0–30.0)

## 2022-07-02 MED ORDER — FLUOXETINE HCL 20 MG PO CAPS: 20.0000 mg | ORAL_CAPSULE | Freq: Every morning | ORAL | Status: DC

## 2022-07-02 MED ORDER — ESCITALOPRAM OXALATE 10 MG PO TABS: 20.0000 mg | ORAL_TABLET | Freq: Every day | ORAL | Status: DC

## 2022-07-02 MED ORDER — AMPHETAMINE-DEXTROAMPHETAMINE 10 MG PO TABS: 20.0000 mg | ORAL_TABLET | Freq: Two times a day (BID) | ORAL | Status: DC

## 2022-07-02 MED ORDER — ACETAMINOPHEN 325 MG PO TABS
650.0000 mg | ORAL_TABLET | Freq: Once | ORAL | Status: AC
  Administered 2022-07-02: 650 mg via ORAL
  Filled 2022-07-02: qty 2

## 2022-07-02 MED ORDER — SERTRALINE HCL 50 MG PO TABS: 200.0000 mg | ORAL_TABLET | Freq: Every day | ORAL | Status: DC

## 2022-07-02 MED ORDER — AMPHETAMINE-DEXTROAMPHET ER 20 MG PO CP24: 20.0000 mg | ORAL_CAPSULE | Freq: Every morning | ORAL | Status: DC

## 2022-07-02 MED ORDER — SULFAMETHOXAZOLE-TRIMETHOPRIM 800-160 MG PO TABS: 1.0000 | ORAL_TABLET | Freq: Two times a day (BID) | ORAL | 0 refills | Status: AC

## 2022-07-02 MED ORDER — CARIPRAZINE HCL 1.5 MG PO CAPS: 1.5000 mg | ORAL_CAPSULE | Freq: Every day | ORAL | Status: DC

## 2022-07-02 MED ORDER — HYDROXYZINE HCL 25 MG PO TABS
25.0000 mg | ORAL_TABLET | Freq: Three times a day (TID) | ORAL | Status: DC | PRN
  Administered 2022-07-02: 25 mg via ORAL
  Filled 2022-07-02: qty 1

## 2022-07-02 NOTE — ED Provider Notes (Signed)
[facility] Provider Note    Event Date/Time   First MD Initiated Contact with Patient 07/02/22 2219     (approximate)   History   Psychiatric Evaluation   HPI  Dakota Oliver is a 32 y.o. male who comes in with depression.  Patient reportedly quit taking his meds about 1 week ago.  Denies any SI or HI.  He does report alcohol and marijuana use.  Denies any other medical concerns other than his left hand he reports some redness near the bed of his fingernails.  He does report that he occasionally tries to push on it and occasionally will get some drainage.  Denies any fevers.     Physical Exam   Triage Vital Signs: ED Triage Vitals [07/02/22 2152]  Enc Vitals Group     BP 119/78     Pulse Rate (!) 107     Resp 20     Temp 98.7 F (37.1 C)     Temp Source Oral     SpO2 100 %     Weight 150 lb (68 kg)     Height 5\' 6"  (1.676 m)     Head Circumference      Peak Flow      Pain Score 0     Pain Loc      Pain Edu?      Excl. in [place]?     Most recent vital signs: Vitals:   07/02/22 2152  BP: 119/78  Pulse: (!) 107  Resp: 20  Temp: 98.7 F (37.1 C)  SpO2: 100%     General: Awake, no distress.  CV:  Good peripheral perfusion.  Resp:  Normal effort.  Abd:  No distention.  Other:  Is some slight redness on the tips of his left hand third fourth fifth digit.  The third digit seems maybe a little bit full but the other 2 just seem very flat with some mild redness.  No signs of infection of the pulp.  It is just some slight redness near the nailbed.  There is full range of motion of fingers otherwise.   ED Results / Procedures / Treatments   Labs (all labs ordered are listed, but only abnormal results are displayed) Labs Reviewed  CBC  COMPREHENSIVE METABOLIC PANEL  ETHANOL  SALICYLATE LEVEL  ACETAMINOPHEN LEVEL  URINE DRUG SCREEN, QUALITATIVE ([place])    PROCEDURES:  Critical Care performed:  No  Procedures   MEDICATIONS ORDERED IN ED: Medications - No data to display   IMPRESSION / MDM / [place] / ED COURSE  I reviewed the triage vital signs and the nursing notes.   Patient's presentation is most consistent with acute presentation with potential threat to life or bodily function.   Patient does have some slight redness on the tips of his fingers not significant fullness except for may be slight fullness on the fourth digit.  We discussed trialing I&D although not significant fluctuation.  Patient did not want to do I&D he declined.  He was willing to trial some antibiotics for instead.  We discussed doing this soaking of his hands at home and returning if they become worse and that he may need to have these drained if not getting better with the antibiotics and he expressed understanding but he currently states that he is afraid of needles and does not want to have any needles placed into them.  Given they are not significantly fluctuant at this time  I think that is reasonable.  No signs of flexor tenosynovitis, felon or other acute pathology.  Labs are reassuring normal white count.  CMP normal.  Alcohol Tylenol negative.  No exam findings to suggest medical cause of current presentation. Will order psychiatric screening labs and discuss further w/ psychiatric service.  D/d includes but is not limited to psychiatric disease, behavioral/personality disorder, inadequate socioeconomic support, medical.  Based on HPI, exam, unremarkable labs, no concern for acute medical problem at this time. No rigidity, clonus, hyperthermia, focal neurologic deficit, diaphoresis, tachycardia, meningismus, ataxia, gait abnormality or other finding to suggest this visit represents a non-psychiatric problem. Screening labs reviewed.    Given this, pt medically cleared, to be dispositioned per Psych.    The patient has been placed in psychiatric observation due to the need to provide a  safe environment for the patient while obtaining psychiatric consultation and evaluation, as well as ongoing medical and medication management to treat the patient's condition.  The patient has not been placed under full IVC at this time.    The patient is on the cardiac monitor to evaluate for evidence of arrhythmia and/or significant heart rate changes.      FINAL CLINICAL IMPRESSION(S) / ED DIAGNOSES   Final diagnoses:  Paronychia of finger of left hand  Depression, unspecified depression type     Rx / DC Orders   ED Discharge Orders          Ordered    sulfamethoxazole-trimethoprim (BACTRIM DS) 800-160 MG tablet  2 times daily        07/02/22 2245             Note:  This document was prepared using Dragon voice recognition software and may include unintentional dictation errors.   Vanessa [place], MD 07/02/22 [phone]

## 2022-07-02 NOTE — ED Triage Notes (Addendum)
Pt reports he quit taking his meds 1 week ago and has been depressed.  Pt denies SI or HI.   Pt is Voluntary Pt reports etoh and marijuana use.  Pt calm and cooperative.

## 2022-07-02 NOTE — ED Notes (Signed)
Jeans Orange jacket Black shoes Black tee shirt Multi colored underwear Cigarettes lighter     Pt has nose and ear piercings that will not come out.

## 2022-07-02 NOTE — Discharge Instructions (Addendum)
We are starting you on some antibiotics for potential finger infections.  We recommend trying to see if we can drain some of it but you have declined.  If this is not working then you need to return to the ER for repeat evaluation to see if it can be drained at that time.  He can try warm compresses and trying to push it out at home.  Return to ER if develop fevers worsening symptoms or any other concerns.  You were also seen and cleared by the psychiatry service.  Please read through the included information regarding outpatient resources for substance abuse and for ongoing mental health services ([facility]).

## 2022-07-03 DIAGNOSIS — F331 Major depressive disorder, recurrent, moderate: Secondary | ICD-10-CM | POA: Insufficient documentation

## 2022-07-03 DIAGNOSIS — F32A Depression, unspecified: Secondary | ICD-10-CM

## 2022-07-03 LAB — URINE DRUG SCREEN, QUALITATIVE (ARMC ONLY)
Amphetamines, Ur Screen: POSITIVE — AB
Barbiturates, Ur Screen: NOT DETECTED
Benzodiazepine, Ur Scrn: NOT DETECTED
Cannabinoid 50 Ng, Ur ~~LOC~~: POSITIVE — AB
Cocaine Metabolite,Ur ~~LOC~~: NOT DETECTED
MDMA (Ecstasy)Ur Screen: NOT DETECTED
Methadone Scn, Ur: NOT DETECTED
Opiate, Ur Screen: NOT DETECTED
Phencyclidine (PCP) Ur S: NOT DETECTED
Tricyclic, Ur Screen: NOT DETECTED

## 2022-07-03 MED ORDER — AMPHETAMINE-DEXTROAMPHETAMINE 10 MG PO TABS: 20.0000 mg | ORAL_TABLET | Freq: Two times a day (BID) | ORAL | Status: DC

## 2022-07-03 NOTE — ED Notes (Signed)
Pt verbalized understanding of discharge instructions, no questions at this time.

## 2022-07-03 NOTE — Consult Note (Signed)
[facility] Consult   Reason for Consult: Psychiatric Evaluation  Referring Physician: Dr. Jari Pigg Patient Identification: Dakota Oliver MRN:  [id number] Principal Diagnosis: <principal problem not specified> Diagnosis:  Active Problems:   MDD (major depressive disorder), recurrent episode, moderate   Total Time spent with patient: 1 hour  Subjective: " I ran out of my Adderall and my Valium."  Dakota Oliver is a 32 y.o. male patient presented to the patient presents with cessation of medication use approximately one week ago. Denies suicidal or homicidal ideation. The patient reports alcohol and marijuana use. Concern regarding redness and drainage near the bed of left fingernails. The patient, arrived via POV voluntary, presents to [facility] ED with cessation of medication usage around one week ago. Denies suicidal or homicidal thoughts. Acknowledges alcohol and marijuana consumption. Reports left hand concern, describing redness near the nail bed, occasionally experiencing drainage after pushing on it.The patient ran out of his valium and Adderall which he is requesting to have it prescribed to him. The patient shared that he does have his Zoloft but not his Adderall of Valium. Face-to-face evaluation conducted with the patient. Chart reviewed and consultation sought with Dr. Karma Greaser for patient care. Agreement reached with EDP that patient does not meet criteria for psychiatric inpatient admission. The patient is alert and oriented x4, calm, cooperative, and mood-congruent with affect. No apparent deficits in response to internal or external stimuli. Absence of delusional thinking, auditory or visual hallucinations. The patient denies suicidal, homicidal, or self-harm ideations. No psychotic or paranoid behaviors observed. Redness noted near left fingernail bed. Occasional drainage reported upon manipulation. Assessment and Plan:  Patient reports discontinuation of  medications approximately one week ago. Further evaluation needed to determine the reason for discontinuation and any potential consequences. The patient admits to alcohol and marijuana use. Consider substance abuse counseling and support. Hand Redness and Drainage: Evaluate left hand redness and drainage for potential infection or other pathology. Consider referral to dermatology or hand surgery for further assessment and management.  Disposition: Patient does not meet the criteria for psychiatric inpatient admission  HPI: Per Dr. Jari Pigg, Dakota Oliver is a 32 y.o. male who comes in with depression.  Patient reportedly quit taking his meds about 1 week ago.  Denies any SI or HI.  He does report alcohol and marijuana use.  Denies any other medical concerns other than his left hand he reports some redness near the bed of his fingernails.  He does report that he occasionally tries to push on it and occasionally will get some drainage.  Denies any fevers.   Past Psychiatric History:  Depression  Risk to Self:   Risk to Others:   Prior Inpatient Therapy:   Prior Outpatient Therapy:    Past Medical History:  Past Medical History:  Diagnosis Date   Arthritis    Depression    Low testosterone     Past Surgical History:  Procedure Laterality Date   HYDROCELE EXCISION / REPAIR     Shrapnel removal from chest     Family History:  Family History  Problem Relation Age of Onset   Prostate cancer Neg Hx    Kidney cancer Neg Hx    Bladder Cancer Neg Hx    Family Psychiatric  History: Mother depression Social History:  Social History   Substance and Sexual Activity  Alcohol Use Yes     Social History   Substance and Sexual Activity  Drug Use Yes   Types: Marijuana, Cocaine  Social History   Socioeconomic History   Marital status: Married    Spouse name: Not on file   Number of children: Not on file   Years of education: Not on file   Highest education level: Not on file   Occupational History   Not on file  Tobacco Use   Smoking status: Every Day    Packs/day: .5    Types: Cigarettes   Smokeless tobacco: Never  Vaping Use   Vaping Use: Never used  Substance and Sexual Activity   Alcohol use: Yes   Drug use: Yes    Types: Marijuana, Cocaine   Sexual activity: Not on file  Other Topics Concern   Not on file  Social History Narrative   Not on file   Social Determinants of Health   Financial Resource Strain: Not on file  Food Insecurity: Not on file  Transportation Needs: Not on file  Physical Activity: Not on file  Stress: Not on file  Social Connections: Not on file   Additional Social History:    Allergies:   Allergies  Allergen Reactions   Prochlorperazine Other (See Comments)    Dystonic reaction   Amoxicillin Other (See Comments)    Nausea/ vomiting     Labs:  Results for orders placed or performed during the hospital encounter of 07/02/22 (from the past 48 hour(s))  Comprehensive metabolic panel     Status: Abnormal   Collection Time: 07/02/22  9:56 PM  Result Value Ref Range   Sodium 138 135 - 145 mmol/L   Potassium 3.6 3.5 - 5.1 mmol/L   Chloride 103 98 - 111 mmol/L   CO2 24 22 - 32 mmol/L   Glucose, Bld 99 70 - 99 mg/dL    Comment: Glucose reference range applies only to samples taken after fasting for at least 8 hours.   BUN 11 6 - 20 mg/dL   Creatinine, Ser 0.79 0.61 - 1.24 mg/dL   Calcium 9.5 8.9 - 10.3 mg/dL   Total Protein 8.4 (H) 6.5 - 8.1 g/dL   Albumin 4.7 3.5 - 5.0 g/dL   AST 21 15 - 41 U/L   ALT 15 0 - 44 U/L   Alkaline Phosphatase 98 38 - 126 U/L   Total Bilirubin 0.6 0.3 - 1.2 mg/dL   GFR, Estimated >60 >60 mL/min    Comment: (NOTE) Calculated using the CKD-EPI Creatinine Equation (2021)    Anion gap 11 5 - 15    Comment: Performed at [facility], [address] [place], [place] [address]  Ethanol     Status: None   Collection Time: 07/02/22  9:56 PM  Result Value Ref Range   Alcohol,  Ethyl (B) <10 <10 mg/dL    Comment: (NOTE) Lowest detectable limit for serum alcohol is 10 mg/dL.  For medical purposes only. Performed at [facility], [address] [place], [place] [id number]   Salicylate level     Status: Abnormal   Collection Time: 07/02/22  9:56 PM  Result Value Ref Range   Salicylate Lvl Q000111Q (L) 7.0 - 30.0 mg/dL    Comment: Performed at [facility], [address] [place], [place] [address]  Acetaminophen level     Status: Abnormal   Collection Time: 07/02/22  9:56 PM  Result Value Ref Range   Acetaminophen (Tylenol), Serum <10 (L) 10 - 30 ug/mL    Comment: (NOTE) Therapeutic concentrations vary significantly. A range of 10-30 ug/mL  may be an effective  concentration for many patients. However, some  are best treated at concentrations outside of this range. Acetaminophen concentrations >150 ug/mL at 4 hours after ingestion  and >50 ug/mL at 12 hours after ingestion are often associated with  toxic reactions.  Performed at [facility], [address] [place], [place] [address]   cbc     Status: None   Collection Time: 07/02/22  9:56 PM  Result Value Ref Range   WBC 8.8 4.0 - 10.5 K/uL   RBC 5.21 4.22 - 5.81 MIL/uL   Hemoglobin 15.5 13.0 - 17.0 g/dL   HCT 45.4 39.0 - 52.0 %   MCV 87.1 80.0 - 100.0 fL   MCH 29.8 26.0 - 34.0 pg   MCHC 34.1 30.0 - 36.0 g/dL   RDW 13.2 11.5 - 15.5 %   Platelets 382 150 - 400 K/uL   nRBC 0.0 0.0 - 0.2 %    Comment: Performed at [facility], [address], [place], [place] [address]  Urine Drug Screen, Qualitative     Status: Abnormal   Collection Time: 07/03/22 12:26 AM  Result Value Ref Range   Tricyclic, Ur Screen NONE DETECTED NONE DETECTED   Amphetamines, Ur Screen POSITIVE (A) NONE DETECTED   MDMA (Ecstasy)Ur Screen NONE DETECTED NONE DETECTED   Cocaine Metabolite,Ur [place] NONE DETECTED NONE DETECTED   Opiate, Ur Screen NONE DETECTED NONE DETECTED   Phencyclidine  (PCP) Ur S NONE DETECTED NONE DETECTED   Cannabinoid 50 Ng, Ur [place] POSITIVE (A) NONE DETECTED   Barbiturates, Ur Screen NONE DETECTED NONE DETECTED   Benzodiazepine, Ur Scrn NONE DETECTED NONE DETECTED   Methadone Scn, Ur NONE DETECTED NONE DETECTED    Comment: (NOTE) Tricyclics + metabolites, urine    Cutoff 1000 ng/mL Amphetamines + metabolites, urine  Cutoff 1000 ng/mL MDMA (Ecstasy), urine              Cutoff 500 ng/mL Cocaine Metabolite, urine          Cutoff 300 ng/mL Opiate + metabolites, urine        Cutoff 300 ng/mL Phencyclidine (PCP), urine         Cutoff 25 ng/mL Cannabinoid, urine                 Cutoff 50 ng/mL Barbiturates + metabolites, urine  Cutoff 200 ng/mL Benzodiazepine, urine              Cutoff 200 ng/mL Methadone, urine                   Cutoff 300 ng/mL  The urine drug screen provides only a preliminary, unconfirmed analytical test result and should not be used for non-medical purposes. Clinical consideration and professional judgment should be applied to any positive drug screen result due to possible interfering substances. A more specific alternate chemical method must be used in order to obtain a confirmed analytical result. Gas chromatography / mass spectrometry (GC/MS) is the preferred confirm atory method. Performed at [facility], [address] [place], [place] [address]     Current Facility-Administered Medications  Medication Dose Route Frequency Provider Last Rate Last Admin   amphetamine-dextroamphetamine (ADDERALL) tablet 20 mg  20 mg Oral BID Renda Rolls, RPH       cariprazine (VRAYLAR) capsule 1.5 mg  1.5 mg Oral Daily Ward, Kristen N, DO       hydrOXYzine (ATARAX) tablet 25 mg  25 mg Oral TID PRN Ward, Delice Bison, DO  25 mg at 07/02/22 2331   sertraline (ZOLOFT) tablet 200 mg  200 mg Oral Daily Ward, Delice Bison, DO       Current Outpatient Medications  Medication Sig Dispense Refill   amphetamine-dextroamphetamine  (ADDERALL) 20 MG tablet Take 20 mg by mouth 2 (two) times daily.     diazepam (VALIUM) 5 MG tablet TAKE 1 TABLET BY MOUTH THREE TIMES DAILY (MAKE  APPOINTMENT) 45 tablet 0   sertraline (ZOLOFT) 100 MG tablet Take 200 mg by mouth daily.     sulfamethoxazole-trimethoprim (BACTRIM DS) 800-160 MG tablet Take 1 tablet by mouth 2 (two) times daily for 7 days. 14 tablet 0   VRAYLAR 1.5 MG capsule Take 1.5 mg by mouth daily.     amphetamine-dextroamphetamine (ADDERALL XR) 20 MG 24 hr capsule Take 20 mg by mouth every morning.  0   cyclobenzaprine (FLEXERIL) 10 MG tablet Take 10 mg by mouth 3 (three) times daily as needed for muscle spasms.     dicyclomine (BENTYL) 10 MG capsule Take 1 capsule (10 mg total) by mouth 3 (three) times daily as needed for up to 5 days for spasms. 15 capsule 0   escitalopram (LEXAPRO) 20 MG tablet Take 20 mg by mouth daily.     FLUoxetine (PROZAC) 20 MG capsule Take 20 mg by mouth every morning.  0   hydrOXYzine (ATARAX/VISTARIL) 25 MG tablet Take 25 mg by mouth 3 (three) times daily as needed.     ibuprofen (ADVIL,MOTRIN) 800 MG tablet Take 1 tablet (800 mg total) by mouth every 8 (eight) hours as needed. 28 tablet 0    Musculoskeletal: Strength & Muscle Tone: within normal limits Gait & Station: normal Patient leans: N/A Psychiatric Specialty Exam:  Presentation  General Appearance:  Disheveled  Eye Contact: Minimal  Speech: Clear and Coherent  Speech Volume: Decreased  Handedness: Right   Mood and Affect  Mood: Depressed  Affect: Congruent   Thought Process  Thought Processes: Coherent  Descriptions of Associations:Intact  Orientation:Full (Time, Place and Person)  Thought Content:Logical  History of Schizophrenia/Schizoaffective disorder:No data recorded Duration of Psychotic Symptoms:No data recorded Hallucinations:Hallucinations: None  Ideas of Reference:None  Suicidal Thoughts:Suicidal Thoughts: No  Homicidal Thoughts:Homicidal  Thoughts: No   Sensorium  Memory: Immediate Good; Recent Good; Remote Good  Judgment: Fair  Insight: Fair   Community education officer  Concentration: Fair  Attention Span: Fair  Recall: Good  Fund of Knowledge: Good  Language: Good   Psychomotor Activity  Psychomotor Activity: Psychomotor Activity: Normal   Assets  Assets: Communication Skills; Desire for Improvement; Resilience; Social Support   Sleep  Sleep: Sleep: Fair   Physical Exam: Physical Exam Vitals and nursing note reviewed.  Constitutional:      Appearance: Normal appearance. He is normal weight.  HENT:     Head: Normocephalic and atraumatic.     Right Ear: External ear normal.     Left Ear: External ear normal.     Nose: Nose normal.     Mouth/Throat:     Mouth: Mucous membranes are moist.  Cardiovascular:     Rate and Rhythm: Normal rate.     Pulses: Normal pulses.  Pulmonary:     Effort: Pulmonary effort is normal.  Musculoskeletal:        General: Normal range of motion.     Cervical back: Normal range of motion and neck supple.  Neurological:     General: No focal deficit present.     Mental Status: He is alert  and oriented to person, place, and time.  Psychiatric:        Attention and Perception: Attention and perception normal.        Mood and Affect: Mood is anxious and depressed.        Speech: Speech normal.        Behavior: Behavior is withdrawn. Behavior is cooperative.        Thought Content: Thought content normal.        Cognition and Memory: Cognition and memory normal.        Judgment: Judgment is inappropriate.    Review of Systems  Psychiatric/Behavioral:  Positive for depression. The patient is nervous/anxious.    Blood pressure 118/68, pulse 98, temperature 98.2 F (36.8 C), temperature source Oral, resp. rate 18, height 5\' 6"  (1.676 m), weight 68 kg, SpO2 99 %. Body mass index is 24.21 kg/m.  Treatment Plan Summary: Medication management and Plan    Patient does not meet the criteria for psychiatric inpatient admission  Disposition: No evidence of imminent risk to self or others at present.   Patient does not meet criteria for psychiatric inpatient admission. Supportive therapy provided about ongoing stressors. Discussed crisis plan, support from social network, calling 911, coming to the Emergency Department, and calling Suicide Hotline.  Caroline Sauger, NP 07/03/2022 2:56 AM

## 2022-07-04 ENCOUNTER — Ambulatory Visit: Payer: Self-pay | Admitting: Nurse Practitioner

## 2022-07-15 ENCOUNTER — Ambulatory Visit: Payer: Self-pay | Admitting: Nurse Practitioner

## 2022-07-16 ENCOUNTER — Encounter: Payer: Self-pay | Admitting: Nurse Practitioner

## 2022-07-16 ENCOUNTER — Ambulatory Visit (INDEPENDENT_AMBULATORY_CARE_PROVIDER_SITE_OTHER): Payer: Self-pay | Admitting: Nurse Practitioner

## 2022-07-16 VITALS — BP 132/88 | HR 122 | Ht 66.0 in | Wt 170.6 lb

## 2022-07-16 DIAGNOSIS — F988 Other specified behavioral and emotional disorders with onset usually occurring in childhood and adolescence: Secondary | ICD-10-CM

## 2022-07-16 DIAGNOSIS — F331 Major depressive disorder, recurrent, moderate: Secondary | ICD-10-CM | POA: Diagnosis not present

## 2022-07-16 DIAGNOSIS — F419 Anxiety disorder, unspecified: Secondary | ICD-10-CM | POA: Diagnosis not present

## 2022-07-16 DIAGNOSIS — M62838 Other muscle spasm: Secondary | ICD-10-CM | POA: Diagnosis not present

## 2022-07-16 MED ORDER — CYCLOBENZAPRINE HCL 10 MG PO TABS
10.0000 mg | ORAL_TABLET | Freq: Three times a day (TID) | ORAL | 0 refills | Status: DC | PRN
Start: 2022-07-16 — End: 2022-12-16

## 2022-07-16 MED ORDER — SERTRALINE HCL 100 MG PO TABS: 200.0000 mg | ORAL_TABLET | Freq: Every day | ORAL | 0 refills | Status: DC

## 2022-07-16 MED ORDER — HYDROXYZINE HCL 25 MG PO TABS
25.0000 mg | ORAL_TABLET | Freq: Three times a day (TID) | ORAL | 0 refills | Status: AC | PRN
Start: 2022-07-16 — End: ?

## 2022-07-16 NOTE — Patient Instructions (Addendum)
1) Refills 2) Will send controls to Dr. Yves Dill tomorrow (adderall and valium)

## 2022-07-16 NOTE — Progress Notes (Signed)
Established Patient Office Visit  Subjective:  Patient ID: Dakota Oliver, male    DOB: 08/29/1990  Age: 32 y.o. MRN: [id number]  Chief Complaint  Patient presents with   Follow-up    Rx refills    Dr. Eustaquio Boyden patient who is requesting refills today.      No other concerns at this time.   Past Medical History:  Diagnosis Date   Arthritis    Depression    Low testosterone     Past Surgical History:  Procedure Laterality Date   HYDROCELE EXCISION / REPAIR     Shrapnel removal from chest      Social History   Socioeconomic History   Marital status: Married    Spouse name: Not on file   Number of children: Not on file   Years of education: Not on file   Highest education level: Not on file  Occupational History   Not on file  Tobacco Use   Smoking status: Every Day    Packs/day: .5    Types: Cigarettes   Smokeless tobacco: Never  Vaping Use   Vaping Use: Never used  Substance and Sexual Activity   Alcohol use: Yes   Drug use: Yes    Types: Marijuana, Cocaine   Sexual activity: Not on file  Other Topics Concern   Not on file  Social History Narrative   Not on file   Social Determinants of Health   Financial Resource Strain: Not on file  Food Insecurity: Not on file  Transportation Needs: Not on file  Physical Activity: Not on file  Stress: Not on file  Social Connections: Not on file  Intimate Partner Violence: Not on file    Family History  Problem Relation Age of Onset   Prostate cancer Neg Hx    Kidney cancer Neg Hx    Bladder Cancer Neg Hx     Allergies  Allergen Reactions   Prochlorperazine Other (See Comments)    Dystonic reaction   Amoxicillin Other (See Comments)    Nausea/ vomiting     Review of Systems  Constitutional: Negative.   HENT: Negative.    Eyes: Negative.   Respiratory: Negative.    Cardiovascular: Negative.   Gastrointestinal: Negative.   Genitourinary: Negative.   Musculoskeletal: Negative.    Skin: Negative.   Neurological: Negative.   Endo/Heme/Allergies: Negative.   Psychiatric/Behavioral: Negative.         Objective:   Ht 5\' 6"  (1.676 m)   Wt 170 lb 9.6 oz (77.4 kg)   BMI 27.54 kg/m   Vitals:   07/16/22 1110  Height: 5\' 6"  (1.676 m)  Weight: 170 lb 9.6 oz (77.4 kg)  BMI (Calculated): 27.55    Physical Exam Vitals reviewed.  Constitutional:      Appearance: Normal appearance.  HENT:     Head: Normocephalic.     Nose: Nose normal.     Mouth/Throat:     Mouth: Mucous membranes are moist.  Eyes:     Pupils: Pupils are equal, round, and reactive to light.  Cardiovascular:     Rate and Rhythm: Normal rate and regular rhythm.  Pulmonary:     Effort: Pulmonary effort is normal.     Breath sounds: Normal breath sounds.  Abdominal:     General: Bowel sounds are normal.     Palpations: Abdomen is soft.  Musculoskeletal:        General: Normal range of motion.     Cervical back: Normal  range of motion and neck supple.  Skin:    General: Skin is warm and dry.  Neurological:     Mental Status: He is alert and oriented to person, place, and time.  Psychiatric:        Mood and Affect: Mood normal.        Behavior: Behavior normal.      No results found for any visits on 07/16/22.  Recent Results (from the past 2160 hour(s))  Basic metabolic panel     Status: Abnormal   Collection Time: 05/10/22  4:07 PM  Result Value Ref Range   Sodium 137 135 - 145 mmol/L   Potassium 3.8 3.5 - 5.1 mmol/L   Chloride 105 98 - 111 mmol/L   CO2 20 (L) 22 - 32 mmol/L   Glucose, Bld 100 (H) 70 - 99 mg/dL    Comment: Glucose reference range applies only to samples taken after fasting for at least 8 hours.   BUN 12 6 - 20 mg/dL   Creatinine, Ser 1.61 0.61 - 1.24 mg/dL   Calcium 9.7 8.9 - 09.6 mg/dL   GFR, Estimated >04 >54 mL/min    Comment: (NOTE) Calculated using the CKD-EPI Creatinine Equation (2021)    Anion gap 12 5 - 15    Comment: Performed at [facility], [address] [place], [place] [address]  CBC with Differential     Status: None   Collection Time: 05/10/22  4:07 PM  Result Value Ref Range   WBC 9.2 4.0 - 10.5 K/uL   RBC 5.00 4.22 - 5.81 MIL/uL   Hemoglobin 14.7 13.0 - 17.0 g/dL   HCT 91.4 78.2 - 95.6 %   MCV 83.2 80.0 - 100.0 fL   MCH 29.4 26.0 - 34.0 pg   MCHC 35.3 30.0 - 36.0 g/dL   RDW 21.3 08.6 - 57.8 %   Platelets 343 150 - 400 K/uL   nRBC 0.0 0.0 - 0.2 %   Neutrophils Relative % 68 %   Neutro Abs 6.2 1.7 - 7.7 K/uL   Lymphocytes Relative 25 %   Lymphs Abs 2.3 0.7 - 4.0 K/uL   Monocytes Relative 6 %   Monocytes Absolute 0.6 0.1 - 1.0 K/uL   Eosinophils Relative 0 %   Eosinophils Absolute 0.0 0.0 - 0.5 K/uL   Basophils Relative 0 %   Basophils Absolute 0.0 0.0 - 0.1 K/uL   Immature Granulocytes 1 %   Abs Immature Granulocytes 0.06 0.00 - 0.07 K/uL    Comment: Performed at [facility], [address] [place], [place] [address]  Ethanol     Status: None   Collection Time: 05/10/22  4:07 PM  Result Value Ref Range   Alcohol, Ethyl (B) <10 <10 mg/dL    Comment: (NOTE) Lowest detectable limit for serum alcohol is 10 mg/dL.  For medical purposes only. Performed at [facility], [address] [place], [place] [address]   Acetaminophen level     Status: Abnormal   Collection Time: 05/10/22  4:07 PM  Result Value Ref Range   Acetaminophen (Tylenol), Serum <10 (L) 10 - 30 ug/mL    Comment: (NOTE) Therapeutic concentrations vary significantly. A range of 10-30 ug/mL  may be an effective concentration for many patients. However, some  are best treated at concentrations outside of this range. Acetaminophen concentrations >150 ug/mL at 4 hours after ingestion  and >50 ug/mL at 12 hours after ingestion are often associated with  toxic reactions.  Performed at [facility], [address] [place], [place] [address]   Salicylate level     Status: Abnormal   Collection  Time: 05/10/22  4:07 PM  Result Value Ref Range   Salicylate Lvl <7.0 (L) 7.0 - 30.0 mg/dL    Comment: Performed at [facility], [address], [place], [place] [address]  Urine Drug Screen, Qualitative     Status: Abnormal   Collection Time: 05/10/22  4:51 PM  Result Value Ref Range   Tricyclic, Ur Screen NONE DETECTED NONE DETECTED   Amphetamines, Ur Screen POSITIVE (A) NONE DETECTED   MDMA (Ecstasy)Ur Screen NONE DETECTED NONE DETECTED   Cocaine Metabolite,Ur [place] NONE DETECTED NONE DETECTED   Opiate, Ur Screen NONE DETECTED NONE DETECTED   Phencyclidine (PCP) Ur S NONE DETECTED NONE DETECTED   Cannabinoid 50 Ng, Ur  POSITIVE (A) NONE DETECTED   Barbiturates, Ur Screen NONE DETECTED NONE DETECTED   Benzodiazepine, Ur Scrn NONE DETECTED NONE DETECTED   Methadone Scn, Ur NONE DETECTED NONE DETECTED    Comment: (NOTE) Tricyclics + metabolites, urine    Cutoff 1000 ng/mL Amphetamines + metabolites, urine  Cutoff 1000 ng/mL MDMA (Ecstasy), urine              Cutoff 500 ng/mL Cocaine Metabolite, urine          Cutoff 300 ng/mL Opiate + metabolites, urine        Cutoff 300 ng/mL Phencyclidine (PCP), urine         Cutoff 25 ng/mL Cannabinoid, urine                 Cutoff 50 ng/mL Barbiturates + metabolites, urine  Cutoff 200 ng/mL Benzodiazepine, urine              Cutoff 200 ng/mL Methadone, urine                   Cutoff 300 ng/mL  The urine drug screen provides only a preliminary, unconfirmed analytical test result and should not be used for non-medical purposes. Clinical consideration and professional judgment should be applied to any positive drug screen result due to possible interfering substances. A more specific alternate chemical method must be used in order to obtain a confirmed analytical result. Gas chromatography / mass spectrometry (GC/MS) is the preferred confirm atory method. Performed at [facility], [address] [place], [place]  [address]   Comprehensive metabolic panel     Status: Abnormal   Collection Time: 07/02/22  9:56 PM  Result Value Ref Range   Sodium 138 135 - 145 mmol/L   Potassium 3.6 3.5 - 5.1 mmol/L   Chloride 103 98 - 111 mmol/L   CO2 24 22 - 32 mmol/L   Glucose, Bld 99 70 - 99 mg/dL    Comment: Glucose reference range applies only to samples taken after fasting for at least 8 hours.   BUN 11 6 - 20 mg/dL   Creatinine, Ser 5.62 0.61 - 1.24 mg/dL   Calcium 9.5 8.9 - 13.0 mg/dL   Total Protein 8.4 (H) 6.5 - 8.1 g/dL   Albumin 4.7 3.5 - 5.0 g/dL   AST 21 15 - 41 U/L   ALT 15 0 - 44 U/L   Alkaline Phosphatase 98 38 - 126 U/L   Total Bilirubin 0.6 0.3 - 1.2 mg/dL   GFR, Estimated >86 >57 mL/min    Comment: (NOTE) Calculated using the CKD-EPI Creatinine Equation (2021)    Anion  gap 11 5 - 15    Comment: Performed at [facility], [address] [place], [place] [address]  Ethanol     Status: None   Collection Time: 07/02/22  9:56 PM  Result Value Ref Range   Alcohol, Ethyl (B) <10 <10 mg/dL    Comment: (NOTE) Lowest detectable limit for serum alcohol is 10 mg/dL.  For medical purposes only. Performed at [facility], [address] [place], [place] [address]   Salicylate level     Status: Abnormal   Collection Time: 07/02/22  9:56 PM  Result Value Ref Range   Salicylate Lvl <7.0 (L) 7.0 - 30.0 mg/dL    Comment: Performed at [facility], [address] [place], [place] [address]  Acetaminophen level     Status: Abnormal   Collection Time: 07/02/22  9:56 PM  Result Value Ref Range   Acetaminophen (Tylenol), Serum <10 (L) 10 - 30 ug/mL    Comment: (NOTE) Therapeutic concentrations vary significantly. A range of 10-30 ug/mL  may be an effective concentration for many patients. However, some  are best treated at concentrations outside of this range. Acetaminophen concentrations >150 ug/mL at 4 hours after ingestion  and >50 ug/mL at 12 hours after ingestion  are often associated with  toxic reactions.  Performed at [facility], [address] [place], [place] [address]   cbc     Status: None   Collection Time: 07/02/22  9:56 PM  Result Value Ref Range   WBC 8.8 4.0 - 10.5 K/uL   RBC 5.21 4.22 - 5.81 MIL/uL   Hemoglobin 15.5 13.0 - 17.0 g/dL   HCT 29.5 62.1 - 30.8 %   MCV 87.1 80.0 - 100.0 fL   MCH 29.8 26.0 - 34.0 pg   MCHC 34.1 30.0 - 36.0 g/dL   RDW 65.7 84.6 - 96.2 %   Platelets 382 150 - 400 K/uL   nRBC 0.0 0.0 - 0.2 %    Comment: Performed at [facility], [address] [place], [place] [address]  Urine Drug Screen, Qualitative     Status: Abnormal   Collection Time: 07/03/22 12:26 AM  Result Value Ref Range   Tricyclic, Ur Screen NONE DETECTED NONE DETECTED   Amphetamines, Ur Screen POSITIVE (A) NONE DETECTED   MDMA (Ecstasy)Ur Screen NONE DETECTED NONE DETECTED   Cocaine Metabolite,Ur [place] NONE DETECTED NONE DETECTED   Opiate, Ur Screen NONE DETECTED NONE DETECTED   Phencyclidine (PCP) Ur S NONE DETECTED NONE DETECTED   Cannabinoid 50 Ng, Ur [place] POSITIVE (A) NONE DETECTED   Barbiturates, Ur Screen NONE DETECTED NONE DETECTED   Benzodiazepine, Ur Scrn NONE DETECTED NONE DETECTED   Methadone Scn, Ur NONE DETECTED NONE DETECTED    Comment: (NOTE) Tricyclics + metabolites, urine    Cutoff 1000 ng/mL Amphetamines + metabolites, urine  Cutoff 1000 ng/mL MDMA (Ecstasy), urine              Cutoff 500 ng/mL Cocaine Metabolite, urine          Cutoff 300 ng/mL Opiate + metabolites, urine        Cutoff 300 ng/mL Phencyclidine (PCP), urine         Cutoff 25 ng/mL Cannabinoid, urine                 Cutoff 50 ng/mL Barbiturates + metabolites, urine  Cutoff 200 ng/mL Benzodiazepine, urine  Cutoff 200 ng/mL Methadone, urine                   Cutoff 300 ng/mL  The urine drug screen provides only a preliminary, unconfirmed analytical test result and should not be used for non-medical purposes. Clinical  consideration and professional judgment should be applied to any positive drug screen result due to possible interfering substances. A more specific alternate chemical method must be used in order to obtain a confirmed analytical result. Gas chromatography / mass spectrometry (GC/MS) is the preferred confirm atory method. Performed at [facility], [address] [place], [place] [address]       Assessment & Plan:   Problem List Items Addressed This Visit       Other   MDD (major depressive disorder), recurrent episode, moderate - Primary   Attention deficit disorder   Anxiety    No follow-ups on file.   Total time spent: 15 minutes  Orson Eva, NP  07/16/2022

## 2022-07-17 ENCOUNTER — Other Ambulatory Visit: Payer: Self-pay

## 2022-07-18 ENCOUNTER — Other Ambulatory Visit: Payer: Self-pay | Admitting: Internal Medicine

## 2022-07-18 DIAGNOSIS — F908 Attention-deficit hyperactivity disorder, other type: Secondary | ICD-10-CM

## 2022-07-18 DIAGNOSIS — M62838 Other muscle spasm: Secondary | ICD-10-CM

## 2022-07-18 DIAGNOSIS — F411 Generalized anxiety disorder: Secondary | ICD-10-CM

## 2022-07-18 MED ORDER — AMPHETAMINE-DEXTROAMPHETAMINE 20 MG PO TABS: 20.0000 mg | ORAL_TABLET | Freq: Two times a day (BID) | ORAL | 0 refills | Status: DC

## 2022-07-18 MED ORDER — DIAZEPAM 5 MG PO TABS
5.0000 mg | ORAL_TABLET | Freq: Three times a day (TID) | ORAL | 0 refills | Status: DC | PRN
Start: 2022-07-18 — End: 2022-08-06

## 2022-08-06 ENCOUNTER — Ambulatory Visit: Payer: Self-pay | Admitting: Internal Medicine

## 2022-08-06 ENCOUNTER — Encounter: Payer: Self-pay | Admitting: Internal Medicine

## 2022-08-06 DIAGNOSIS — F908 Attention-deficit hyperactivity disorder, other type: Secondary | ICD-10-CM | POA: Diagnosis not present

## 2022-08-06 DIAGNOSIS — F331 Major depressive disorder, recurrent, moderate: Secondary | ICD-10-CM

## 2022-08-06 DIAGNOSIS — F411 Generalized anxiety disorder: Secondary | ICD-10-CM

## 2022-08-06 MED ORDER — AMPHETAMINE-DEXTROAMPHETAMINE 20 MG PO TABS
20.0000 mg | ORAL_TABLET | Freq: Two times a day (BID) | ORAL | 0 refills | Status: DC
Start: 2022-09-06 — End: 2023-02-06

## 2022-08-06 MED ORDER — AMPHETAMINE-DEXTROAMPHETAMINE 20 MG PO TABS
20.0000 mg | ORAL_TABLET | Freq: Two times a day (BID) | ORAL | 0 refills | Status: DC
Start: 2022-10-06 — End: 2023-02-06

## 2022-08-06 MED ORDER — SERTRALINE HCL 100 MG PO TABS
200.0000 mg | ORAL_TABLET | Freq: Every day | ORAL | 0 refills | Status: DC
Start: 2022-08-06 — End: 2022-12-16

## 2022-08-06 MED ORDER — AMPHETAMINE-DEXTROAMPHETAMINE 20 MG PO TABS
20.0000 mg | ORAL_TABLET | Freq: Two times a day (BID) | ORAL | 0 refills | Status: DC
Start: 2022-08-06 — End: 2022-12-16

## 2022-08-06 MED ORDER — DIAZEPAM 5 MG PO TABS
5.0000 mg | ORAL_TABLET | Freq: Three times a day (TID) | ORAL | 2 refills | Status: DC | PRN
Start: 2022-08-06 — End: 2022-12-16

## 2022-08-06 NOTE — Progress Notes (Signed)
Established Patient Office Visit  Subjective:  Patient ID: Dakota Oliver, male    DOB: Feb 03, 1991  Age: 32 y.o. MRN: [id number]  Chief Complaint  Patient presents with   Follow-up    RX refills    No new complaints, here medication refills.     No other concerns at this time.   Past Medical History:  Diagnosis Date   Arthritis    Depression    Low testosterone     Past Surgical History:  Procedure Laterality Date   HYDROCELE EXCISION / REPAIR     Shrapnel removal from chest      Social History   Socioeconomic History   Marital status: Married    Spouse name: Not on file   Number of children: Not on file   Years of education: Not on file   Highest education level: Not on file  Occupational History   Not on file  Tobacco Use   Smoking status: Every Day    Packs/day: .5    Types: Cigarettes   Smokeless tobacco: Never  Vaping Use   Vaping Use: Never used  Substance and Sexual Activity   Alcohol use: Yes   Drug use: Yes    Types: Marijuana, Cocaine   Sexual activity: Not on file  Other Topics Concern   Not on file  Social History Narrative   Not on file   Social Determinants of Health   Financial Resource Strain: Not on file  Food Insecurity: Not on file  Transportation Needs: Not on file  Physical Activity: Not on file  Stress: Not on file  Social Connections: Not on file  Intimate Partner Violence: Not on file    Family History  Problem Relation Age of Onset   Prostate cancer Neg Hx    Kidney cancer Neg Hx    Bladder Cancer Neg Hx     Allergies  Allergen Reactions   Prochlorperazine Other (See Comments)    Dystonic reaction   Amoxicillin Other (See Comments)    Nausea/ vomiting     Review of Systems  All other systems reviewed and are negative.      Objective:   BP 120/80   Pulse 96   Ht 5\' 6"  (1.676 m)   Wt 165 lb 9.6 oz (75.1 kg)   SpO2 97%   BMI 26.73 kg/m   Vitals:   08/06/22 1137  BP: 120/80  Pulse: 96   Height: 5\' 6"  (1.676 m)  Weight: 165 lb 9.6 oz (75.1 kg)  SpO2: 97%  BMI (Calculated): 26.74    Physical Exam Vitals reviewed.  Constitutional:      Appearance: Normal appearance.  HENT:     Head: Normocephalic.     Left Ear: There is no impacted cerumen.     Nose: Nose normal.     Mouth/Throat:     Mouth: Mucous membranes are moist.     Pharynx: No posterior oropharyngeal erythema.  Eyes:     Extraocular Movements: Extraocular movements intact.     Pupils: Pupils are equal, round, and reactive to light.  Cardiovascular:     Rate and Rhythm: Regular rhythm.     Chest Wall: PMI is not displaced.     Pulses: Normal pulses.     Heart sounds: Normal heart sounds. No murmur heard. Pulmonary:     Effort: Pulmonary effort is normal.     Breath sounds: Normal air entry. No rhonchi or rales.  Abdominal:     General: Abdomen  is flat. Bowel sounds are normal. There is no distension.     Palpations: Abdomen is soft. There is no hepatomegaly, splenomegaly or mass.     Tenderness: There is no abdominal tenderness.  Musculoskeletal:        General: Normal range of motion.     Cervical back: Normal range of motion and neck supple.     Right lower leg: No edema.     Left lower leg: No edema.  Skin:    General: Skin is warm and dry.  Neurological:     General: No focal deficit present.     Mental Status: He is alert and oriented to person, place, and time.     Cranial Nerves: No cranial nerve deficit.     Motor: No weakness.  Psychiatric:        Mood and Affect: Mood normal.        Behavior: Behavior normal.      No results found for any visits on 08/06/22.  Recent Results (from the past 2160 hour(Haley Roza))  Basic metabolic panel     Status: Abnormal   Collection Time: 05/10/22  4:07 PM  Result Value Ref Range   Sodium 137 135 - 145 mmol/L   Potassium 3.8 3.5 - 5.1 mmol/L   Chloride 105 98 - 111 mmol/L   CO2 20 (L) 22 - 32 mmol/L   Glucose, Bld 100 (H) 70 - 99 mg/dL     Comment: Glucose reference range applies only to samples taken after fasting for at least 8 hours.   BUN 12 6 - 20 mg/dL   Creatinine, Ser 1.61 0.61 - 1.24 mg/dL   Calcium 9.7 8.9 - 09.6 mg/dL   GFR, Estimated >04 >54 mL/min    Comment: (NOTE) Calculated using the CKD-EPI Creatinine Equation (2021)    Anion gap 12 5 - 15    Comment: Performed at [facility], [address] [place], [place] [address]  CBC with Differential     Status: None   Collection Time: 05/10/22  4:07 PM  Result Value Ref Range   WBC 9.2 4.0 - 10.5 K/uL   RBC 5.00 4.22 - 5.81 MIL/uL   Hemoglobin 14.7 13.0 - 17.0 g/dL   HCT 91.4 78.2 - 95.6 %   MCV 83.2 80.0 - 100.0 fL   MCH 29.4 26.0 - 34.0 pg   MCHC 35.3 30.0 - 36.0 g/dL   RDW 21.3 08.6 - 57.8 %   Platelets 343 150 - 400 K/uL   nRBC 0.0 0.0 - 0.2 %   Neutrophils Relative % 68 %   Neutro Abs 6.2 1.7 - 7.7 K/uL   Lymphocytes Relative 25 %   Lymphs Abs 2.3 0.7 - 4.0 K/uL   Monocytes Relative 6 %   Monocytes Absolute 0.6 0.1 - 1.0 K/uL   Eosinophils Relative 0 %   Eosinophils Absolute 0.0 0.0 - 0.5 K/uL   Basophils Relative 0 %   Basophils Absolute 0.0 0.0 - 0.1 K/uL   Immature Granulocytes 1 %   Abs Immature Granulocytes 0.06 0.00 - 0.07 K/uL    Comment: Performed at [facility], [address] [place], [place] [address]  Ethanol     Status: None   Collection Time: 05/10/22  4:07 PM  Result Value Ref Range   Alcohol, Ethyl (B) <10 <10 mg/dL    Comment: (NOTE) Lowest detectable limit for serum alcohol is 10 mg/dL.  For medical purposes only. Performed at [facility], [phone]  [address] [place], [place] [address]   Acetaminophen level     Status: Abnormal   Collection Time: 05/10/22  4:07 PM  Result Value Ref Range   Acetaminophen (Tylenol), Serum <10 (L) 10 - 30 ug/mL    Comment: (NOTE) Therapeutic concentrations vary significantly. A range of 10-30 ug/mL  may be an effective concentration for many patients.  However, some  are best treated at concentrations outside of this range. Acetaminophen concentrations >150 ug/mL at 4 hours after ingestion  and >50 ug/mL at 12 hours after ingestion are often associated with  toxic reactions.  Performed at [facility], [address] [place], [place] [address]   Salicylate level     Status: Abnormal   Collection Time: 05/10/22  4:07 PM  Result Value Ref Range   Salicylate Lvl <7.0 (L) 7.0 - 30.0 mg/dL    Comment: Performed at [facility], [address] [place], [place] [address]  Urine Drug Screen, Qualitative     Status: Abnormal   Collection Time: 05/10/22  4:51 PM  Result Value Ref Range   Tricyclic, Ur Screen NONE DETECTED NONE DETECTED   Amphetamines, Ur Screen POSITIVE (A) NONE DETECTED   MDMA (Ecstasy)Ur Screen NONE DETECTED NONE DETECTED   Cocaine Metabolite,Ur [place] NONE DETECTED NONE DETECTED   Opiate, Ur Screen NONE DETECTED NONE DETECTED   Phencyclidine (PCP) Ur [place] NONE DETECTED NONE DETECTED   Cannabinoid 50 Ng, Ur [place] POSITIVE (A) NONE DETECTED   Barbiturates, Ur Screen NONE DETECTED NONE DETECTED   Benzodiazepine, Ur Scrn NONE DETECTED NONE DETECTED   Methadone Scn, Ur NONE DETECTED NONE DETECTED    Comment: (NOTE) Tricyclics + metabolites, urine    Cutoff 1000 ng/mL Amphetamines + metabolites, urine  Cutoff 1000 ng/mL MDMA (Ecstasy), urine              Cutoff 500 ng/mL Cocaine Metabolite, urine          Cutoff 300 ng/mL Opiate + metabolites, urine        Cutoff 300 ng/mL Phencyclidine (PCP), urine         Cutoff 25 ng/mL Cannabinoid, urine                 Cutoff 50 ng/mL Barbiturates + metabolites, urine  Cutoff 200 ng/mL Benzodiazepine, urine              Cutoff 200 ng/mL Methadone, urine                   Cutoff 300 ng/mL  The urine drug screen provides only a preliminary, unconfirmed analytical test result and should not be used for non-medical purposes. Clinical consideration and professional judgment  should be applied to any positive drug screen result due to possible interfering substances. A more specific alternate chemical method must be used in order to obtain a confirmed analytical result. Gas chromatography / mass spectrometry (GC/MS) is the preferred confirm atory method. Performed at [facility], [address] [place], [place] [address]   Comprehensive metabolic panel     Status: Abnormal   Collection Time: 07/02/22  9:56 PM  Result Value Ref Range   Sodium 138 135 - 145 mmol/L   Potassium 3.6 3.5 - 5.1 mmol/L   Chloride 103 98 - 111 mmol/L   CO2 24 22 - 32 mmol/L   Glucose, Bld 99 70 - 99 mg/dL    Comment: Glucose reference range applies only to samples taken after fasting for at least 8  hours.   BUN 11 6 - 20 mg/dL   Creatinine, Ser 1.61 0.61 - 1.24 mg/dL   Calcium 9.5 8.9 - 09.6 mg/dL   Total Protein 8.4 (H) 6.5 - 8.1 g/dL   Albumin 4.7 3.5 - 5.0 g/dL   AST 21 15 - 41 U/L   ALT 15 0 - 44 U/L   Alkaline Phosphatase 98 38 - 126 U/L   Total Bilirubin 0.6 0.3 - 1.2 mg/dL   GFR, Estimated >04 >54 mL/min    Comment: (NOTE) Calculated using the CKD-EPI Creatinine Equation (2021)    Anion gap 11 5 - 15    Comment: Performed at [facility], [address] [place], [place] [address]  Ethanol     Status: None   Collection Time: 07/02/22  9:56 PM  Result Value Ref Range   Alcohol, Ethyl (B) <10 <10 mg/dL    Comment: (NOTE) Lowest detectable limit for serum alcohol is 10 mg/dL.  For medical purposes only. Performed at [facility], [address] [place], [place] [address]   Salicylate level     Status: Abnormal   Collection Time: 07/02/22  9:56 PM  Result Value Ref Range   Salicylate Lvl <7.0 (L) 7.0 - 30.0 mg/dL    Comment: Performed at [facility], [address] [place], [place] [address]  Acetaminophen level     Status: Abnormal   Collection Time: 07/02/22  9:56 PM  Result Value Ref Range   Acetaminophen  (Tylenol), Serum <10 (L) 10 - 30 ug/mL    Comment: (NOTE) Therapeutic concentrations vary significantly. A range of 10-30 ug/mL  may be an effective concentration for many patients. However, some  are best treated at concentrations outside of this range. Acetaminophen concentrations >150 ug/mL at 4 hours after ingestion  and >50 ug/mL at 12 hours after ingestion are often associated with  toxic reactions.  Performed at [facility], [address] [place], [place] [address]   cbc     Status: None   Collection Time: 07/02/22  9:56 PM  Result Value Ref Range   WBC 8.8 4.0 - 10.5 K/uL   RBC 5.21 4.22 - 5.81 MIL/uL   Hemoglobin 15.5 13.0 - 17.0 g/dL   HCT 57.8 46.9 - 62.9 %   MCV 87.1 80.0 - 100.0 fL   MCH 29.8 26.0 - 34.0 pg   MCHC 34.1 30.0 - 36.0 g/dL   RDW 52.8 41.3 - 24.4 %   Platelets 382 150 - 400 K/uL   nRBC 0.0 0.0 - 0.2 %    Comment: Performed at [facility], [address], [place], [place] [address]  Urine Drug Screen, Qualitative     Status: Abnormal   Collection Time: 07/03/22 12:26 AM  Result Value Ref Range   Tricyclic, Ur Screen NONE DETECTED NONE DETECTED   Amphetamines, Ur Screen POSITIVE (A) NONE DETECTED   MDMA (Ecstasy)Ur Screen NONE DETECTED NONE DETECTED   Cocaine Metabolite,Ur [place] NONE DETECTED NONE DETECTED   Opiate, Ur Screen NONE DETECTED NONE DETECTED   Phencyclidine (PCP) Ur [place] NONE DETECTED NONE DETECTED   Cannabinoid 50 Ng, Ur [place] POSITIVE (A) NONE DETECTED   Barbiturates, Ur Screen NONE DETECTED NONE DETECTED   Benzodiazepine, Ur Scrn NONE DETECTED NONE DETECTED   Methadone Scn, Ur NONE DETECTED NONE DETECTED    Comment: (NOTE) Tricyclics + metabolites, urine    Cutoff 1000 ng/mL Amphetamines + metabolites, urine  Cutoff 1000 ng/mL MDMA (Ecstasy), urine  Cutoff 500 ng/mL Cocaine Metabolite, urine          Cutoff 300 ng/mL Opiate + metabolites, urine        Cutoff 300 ng/mL Phencyclidine (PCP), urine          Cutoff 25 ng/mL Cannabinoid, urine                 Cutoff 50 ng/mL Barbiturates + metabolites, urine  Cutoff 200 ng/mL Benzodiazepine, urine              Cutoff 200 ng/mL Methadone, urine                   Cutoff 300 ng/mL  The urine drug screen provides only a preliminary, unconfirmed analytical test result and should not be used for non-medical purposes. Clinical consideration and professional judgment should be applied to any positive drug screen result due to possible interfering substances. A more specific alternate chemical method must be used in order to obtain a confirmed analytical result. Gas chromatography / mass spectrometry (GC/MS) is the preferred confirm atory method. Performed at [facility], [address], [place], [place] [address]       Assessment & Plan:   Problem List Items Addressed This Visit   None   No follow-ups on file.   Total time spent: 20 minutes  Luna Fuse, MD  08/06/2022

## 2022-09-16 ENCOUNTER — Ambulatory Visit: Payer: Self-pay | Admitting: Internal Medicine

## 2022-10-26 ENCOUNTER — Encounter: Payer: Self-pay | Admitting: Emergency Medicine

## 2022-10-26 ENCOUNTER — Emergency Department
Admission: EM | Admit: 2022-10-26 | Discharge: 2022-10-26 | Disposition: A | Discharge status: Home / Self Care | Payer: Medicaid Other | Attending: Emergency Medicine | Admitting: Emergency Medicine

## 2022-10-26 ENCOUNTER — Other Ambulatory Visit: Payer: Self-pay

## 2022-10-26 DIAGNOSIS — L03113 Cellulitis of right upper limb: Secondary | ICD-10-CM | POA: Diagnosis not present

## 2022-10-26 MED ORDER — DOXYCYCLINE MONOHYDRATE 100 MG PO TABS: 100.0000 mg | ORAL_TABLET | Freq: Two times a day (BID) | ORAL | 0 refills | Status: AC

## 2022-10-26 NOTE — Discharge Instructions (Addendum)
You were seen in the emergency department and diagnosed with a cellulitis to your right arm.  This could be from a spider bite or a bug bite, could also be from skin picking.  You are given a prescription for antibiotic, it is important that you take as prescribed.  You can alternate Motrin and Tylenol for pain control.  You are given for information for primary care provider.  Return to the emergency department for fever or any worsening symptoms.  You are given information for outpatient counseling and further help for any substance abuse issues.  return to the emergency department at anytime for any worsening symptoms.

## 2022-10-26 NOTE — ED Triage Notes (Signed)
Patient to ED via POV for spider bite to right wrist. Swelling and redness noted to area. Multiple other sores noted on arms. States he took benadryl around 1pm- patient appears drowsy.

## 2022-10-26 NOTE — ED Provider Notes (Signed)
[facility] Provider Note    Event Date/Time   First MD Initiated Contact with Patient 10/26/22 1811     (approximate)   History   Insect Bite   HPI  Dakota Oliver is a 32 y.o. male past medical history significant for substance abuse, who presents to the emergency department with concern for a spider bite.  Patient was very tired, unable to give me a coherent story of why he was in the emergency department.  Required redirection multiple times in order to have him state why he was in the emergency department.  States that he did not use any drugs today, then states that he used "[place]".  States that he took a hydroxyzine prior to arrival.  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  Initially stated that his mother brought him to the emergency department.  He then called his wife who stated that he was here for an insect bite and she did not know what medications he took last night.       Physical Exam   Triage Vital Signs: ED Triage Vitals [10/26/22 1741]  Encounter Vitals Group     BP 123/73     Systolic BP Percentile      Diastolic BP Percentile      Pulse Rate (!) 119     Resp 18     Temp 98.6 F (37 C)     Temp Source Oral     SpO2 94 %     Weight 165 lb (74.8 kg)     Height 5\' 6"  (1.676 m)     Head Circumference      Peak Flow      Pain Score 9     Pain Loc      Pain Education      Exclude from Growth Chart     Most recent vital signs: Vitals:   10/26/22 1741  BP: 123/73  Pulse: (!) 119  Resp: 18  Temp: 98.6 F (37 C)  SpO2: 94%    Physical Exam Constitutional:      Appearance: He is well-developed.  HENT:     Head: Atraumatic.  Eyes:     Conjunctiva/sclera: Conjunctivae normal.     Comments: 4 mm pupils that are equal and reactive  Cardiovascular:     Rate and Rhythm: Regular rhythm.  Pulmonary:     Effort: No respiratory  distress.  Musculoskeletal:     Cervical back: Normal range of motion.  Skin:    General: Skin is warm.     Findings: Rash present.     Comments: Multiple areas of skin picking to bilateral upper and lower extremities.  Isolated area of erythema and warmth to the right forearm.  No fluctuance.  No induration.  No crepitus.  Neurological:     Mental Status: He is disoriented.     Comments: Required multiple redirection.  Able to stand and has a nonfocal neurologic exam.  Ambulatory in the emergency department.  Following simple commands.      IMPRESSION / MDM / ASSESSMENT AND PLAN / ED COURSE  I reviewed the triage vital signs and the nursing notes.  Patient presented to the emergency department for concern for spider bite to his right wrist.  Patient is very somnolent, able to arouse but takes multiple redirection in order to get history  from the patient.  States that he took a Benadryl, then states he took Atarax.    Patient initially denied any drug use but then stated that he did use drugs and did not tell his wife that he had relapsed.  He did call his wife on the phone who stated that he was here for a rash to his right forearm.   Differential diagnosis including substance use, cellulitis.  Clinical picture is not consistent with a necrotizing soft tissue infection.  No fluctuance on exam and no concern for an underlying abscess.  Labs (all labs ordered are listed, but only abnormal results are displayed) Labs interpreted as -    Labs Reviewed - No data to display    On chart review patient has also been prescribed Valium in the past.  Possibly on Valium and Benadryl however concern for underlying substance use.  Patient's pupils are not pinpoint and no respiratory distress, do not feel that Narcan is necessary.  Over the phone had stated to his wife that I was calling in antibiotics and that he needed a ride.  Patient stated that he was not driving.  He also called another friend  on the phone.  Attempting to give further instructions to the patient however he eloped from the emergency department prior to receiving his discharge paperwork.  Patient was given instructions that his antibiotics were called into the pharmacy at [facility].  Given information for rehab facilities and further substance use information given possible concern for relapse.  Patient had a nonfocal neurologic exam and did not have any external signs of head trauma.  Ambulatory in the emergency department.   PROCEDURES:  Critical Care performed: No  Procedures  Patient's presentation is most consistent with acute illness / injury with system symptoms.   MEDICATIONS ORDERED IN ED: Medications - No data to display  FINAL CLINICAL IMPRESSION(S) / ED DIAGNOSES   Final diagnoses:  Cellulitis of right upper extremity     Rx / DC Orders   ED Discharge Orders          Ordered    doxycycline (ADOXA) 100 MG tablet  2 times daily        10/26/22 1908             Note:  This document was prepared using Dragon voice recognition software and may include unintentional dictation errors.   Corena Herter, MD 10/26/22 Corky Crafts

## 2022-11-22 ENCOUNTER — Other Ambulatory Visit: Payer: Self-pay | Admitting: Internal Medicine

## 2022-11-22 DIAGNOSIS — F331 Major depressive disorder, recurrent, moderate: Secondary | ICD-10-CM

## 2022-11-22 DIAGNOSIS — F908 Attention-deficit hyperactivity disorder, other type: Secondary | ICD-10-CM

## 2022-11-28 ENCOUNTER — Ambulatory Visit: Payer: Self-pay | Admitting: Internal Medicine

## 2022-12-12 ENCOUNTER — Emergency Department
Admission: EM | Admit: 2022-12-12 | Discharge: 2022-12-12 | Disposition: A | Discharge status: Home / Self Care | Payer: MEDICAID

## 2022-12-12 ENCOUNTER — Ambulatory Visit: Payer: MEDICAID | Admitting: Internal Medicine

## 2022-12-12 NOTE — ED Notes (Signed)
Pt called and stated that he was leaving and going to UC

## 2022-12-16 ENCOUNTER — Ambulatory Visit: Payer: MEDICAID | Admitting: Internal Medicine

## 2022-12-16 VITALS — BP 120/90 | HR 116 | Ht 66.0 in | Wt 171.2 lb

## 2022-12-16 DIAGNOSIS — L03317 Cellulitis of buttock: Secondary | ICD-10-CM

## 2022-12-16 DIAGNOSIS — F908 Attention-deficit hyperactivity disorder, other type: Secondary | ICD-10-CM

## 2022-12-16 DIAGNOSIS — F411 Generalized anxiety disorder: Secondary | ICD-10-CM | POA: Diagnosis not present

## 2022-12-16 DIAGNOSIS — F331 Major depressive disorder, recurrent, moderate: Secondary | ICD-10-CM | POA: Diagnosis not present

## 2022-12-16 DIAGNOSIS — L0231 Cutaneous abscess of buttock: Secondary | ICD-10-CM | POA: Diagnosis not present

## 2022-12-16 DIAGNOSIS — M62838 Other muscle spasm: Secondary | ICD-10-CM

## 2022-12-16 MED ORDER — DIAZEPAM 5 MG PO TABS: 5.0000 mg | ORAL_TABLET | Freq: Three times a day (TID) | ORAL | 2 refills | Status: AC | PRN

## 2022-12-16 MED ORDER — SERTRALINE HCL 100 MG PO TABS: 200.0000 mg | ORAL_TABLET | Freq: Every day | ORAL | 0 refills | Status: AC

## 2022-12-16 MED ORDER — CYCLOBENZAPRINE HCL 10 MG PO TABS: 10.0000 mg | ORAL_TABLET | Freq: Three times a day (TID) | ORAL | 0 refills | Status: AC | PRN

## 2022-12-16 MED ORDER — CLINDAMYCIN HCL 150 MG PO CAPS
450.0000 mg | ORAL_CAPSULE | Freq: Four times a day (QID) | ORAL | 0 refills | Status: AC
Start: 2022-12-16 — End: 2022-12-23

## 2022-12-16 MED ORDER — TRAMADOL HCL 50 MG PO TABS: 50.0000 mg | ORAL_TABLET | Freq: Four times a day (QID) | ORAL | 0 refills | Status: AC | PRN

## 2022-12-16 MED ORDER — AMPHETAMINE-DEXTROAMPHETAMINE 20 MG PO TABS
20.0000 mg | ORAL_TABLET | Freq: Two times a day (BID) | ORAL | 0 refills | Status: DC
Start: 2022-12-16 — End: 2023-01-15

## 2022-12-16 NOTE — Progress Notes (Signed)
Established Patient Office Visit  Subjective:  Patient ID: Dakota Oliver, male    DOB: 27-Feb-1991  Age: 32 y.o. MRN: [id number]  Chief Complaint  Patient presents with   Follow-up    abcess    C/o draining furuncle on his left gluteus x 1 wk. Denies fever or chills.  No other concerns at this time.   Past Medical History:  Diagnosis Date   Arthritis    Depression    Low testosterone     Past Surgical History:  Procedure Laterality Date   HYDROCELE EXCISION / REPAIR     Shrapnel removal from chest      Social History   Socioeconomic History   Marital status: Married    Spouse name: Not on file   Number of children: Not on file   Years of education: Not on file   Highest education level: Not on file  Occupational History   Not on file  Tobacco Use   Smoking status: Every Day    Current packs/day: 0.50    Types: Cigarettes   Smokeless tobacco: Never  Vaping Use   Vaping status: Never Used  Substance and Sexual Activity   Alcohol use: Yes   Drug use: Yes    Types: Marijuana, Cocaine   Sexual activity: Not on file  Other Topics Concern   Not on file  Social History Narrative   Not on file   Social Determinants of Health   Financial Resource Strain: Not on file  Food Insecurity: Not on file  Transportation Needs: Not on file  Physical Activity: Not on file  Stress: Not on file  Social Connections: Unknown (05/27/2022)   Received from [facility], [facility]   Social Network    Social Network: Not on file  Intimate Partner Violence: Unknown (05/27/2022)   [facility] from [facility], [facility]   HITS    Physically Hurt: Not on file    Insult or Talk Down To: Not on file    Threaten Physical Harm: Not on file    Scream or Curse: Not on file    Family History  Problem Relation Age of Onset   Prostate cancer Neg Hx    Kidney cancer Neg Hx    Bladder Cancer Neg Hx     Allergies  Allergen Reactions   Prochlorperazine Other (See  Comments)    Dystonic reaction   Amoxicillin Other (See Comments)    Nausea/ vomiting     Review of Systems  Constitutional: Negative.   HENT: Negative.    Eyes: Negative.   Respiratory: Negative.    Cardiovascular: Negative.   Gastrointestinal: Negative.   Genitourinary: Negative.   Musculoskeletal: Negative.   Skin: Negative.   Neurological: Negative.   Endo/Heme/Allergies: Negative.   Psychiatric/Behavioral: Negative.         Objective:   BP (!) 120/90   Pulse (!) 116   Ht 5\' 6"  (1.676 m)   Wt 171 lb 3.2 oz (77.7 kg)   SpO2 98%   BMI 27.63 kg/m   Vitals:   12/16/22 0955  BP: (!) 120/90  Pulse: (!) 116  Height: 5\' 6"  (1.676 m)  Weight: 171 lb 3.2 oz (77.7 kg)  SpO2: 98%  BMI (Calculated): 27.65    Physical Exam Vitals reviewed.  Constitutional:      Appearance: Normal appearance.  HENT:     Head: Normocephalic.     Left Ear: There is no impacted cerumen.     Nose: Nose  normal.     Mouth/Throat:     Mouth: Mucous membranes are moist.     Pharynx: No posterior oropharyngeal erythema.  Eyes:     Extraocular Movements: Extraocular movements intact.     Pupils: Pupils are equal, round, and reactive to light.  Cardiovascular:     Rate and Rhythm: Regular rhythm.     Chest Wall: PMI is not displaced.     Pulses: Normal pulses.     Heart sounds: Normal heart sounds. No murmur heard. Pulmonary:     Effort: Pulmonary effort is normal.     Breath sounds: Normal air entry. No rhonchi or rales.  Abdominal:     General: Abdomen is flat. Bowel sounds are normal. There is no distension.     Palpations: Abdomen is soft. There is no hepatomegaly, splenomegaly or mass.     Tenderness: There is no abdominal tenderness.  Musculoskeletal:        General: Normal range of motion.     Cervical back: Normal range of motion and neck supple.     Right lower leg: No edema.     Left lower leg: No edema.  Skin:    General: Skin is warm and dry.     Findings: Erythema  (around medial left gluteus) present.     Comments: Drainage anal cleft..  Neurological:     General: No focal deficit present.     Mental Status: He is alert and oriented to person, place, and time.     Cranial Nerves: No cranial nerve deficit.     Motor: No weakness.  Psychiatric:        Mood and Affect: Mood normal.        Behavior: Behavior normal.      No results found for any visits on 12/16/22.  No results found for this or any previous visit (from the past 2160 hour(Elysse Polidore)).    Assessment & Plan:  As per problem list  Problem List Items Addressed This Visit       Other   MDD (major depressive disorder), recurrent episode, moderate (HCC)   Relevant Medications   sertraline (ZOLOFT) 100 MG tablet   diazepam (VALIUM) 5 MG tablet   Attention deficit disorder   Relevant Medications   amphetamine-dextroamphetamine (ADDERALL) 20 MG tablet   Other Visit Diagnoses     Abscess and cellulitis of gluteal region    -  Primary   Relevant Medications   traMADol (ULTRAM) 50 MG tablet   clindamycin (CLEOCIN) 150 MG capsule   GAD (generalized anxiety disorder)       Relevant Medications   sertraline (ZOLOFT) 100 MG tablet   diazepam (VALIUM) 5 MG tablet   Muscle spasms of neck       Relevant Medications   cyclobenzaprine (FLEXERIL) 10 MG tablet       Return in about 2 weeks (around 12/30/2022).   Total time spent: 30 minutes  Luna Fuse, MD  12/16/2022   This document may have been prepared by [facility] Voice Recognition software and as such may include unintentional dictation errors.

## 2023-01-15 ENCOUNTER — Other Ambulatory Visit: Payer: Self-pay | Admitting: Internal Medicine

## 2023-01-15 DIAGNOSIS — F908 Attention-deficit hyperactivity disorder, other type: Secondary | ICD-10-CM

## 2023-01-16 MED ORDER — AMPHETAMINE-DEXTROAMPHETAMINE 20 MG PO TABS
20.0000 mg | ORAL_TABLET | Freq: Two times a day (BID) | ORAL | 0 refills | Status: AC
Start: 2023-01-16 — End: 2023-02-15

## 2023-02-06 ENCOUNTER — Other Ambulatory Visit: Payer: Self-pay

## 2023-02-06 DIAGNOSIS — F908 Attention-deficit hyperactivity disorder, other type: Secondary | ICD-10-CM

## 2023-03-14 ENCOUNTER — Other Ambulatory Visit: Payer: Self-pay | Admitting: Internal Medicine

## 2023-03-14 DIAGNOSIS — F411 Generalized anxiety disorder: Secondary | ICD-10-CM

## 2023-10-29 ENCOUNTER — Other Ambulatory Visit: Payer: Self-pay

## 2023-10-29 ENCOUNTER — Emergency Department
Admission: EM | Admit: 2023-10-29 | Discharge: 2023-10-29 | Disposition: A | Discharge status: Home / Self Care | Payer: MEDICAID | Attending: Emergency Medicine | Admitting: Emergency Medicine

## 2023-10-29 ENCOUNTER — Emergency Department: Payer: MEDICAID

## 2023-10-29 DIAGNOSIS — R7309 Other abnormal glucose: Secondary | ICD-10-CM | POA: Diagnosis not present

## 2023-10-29 DIAGNOSIS — T40601A Poisoning by unspecified narcotics, accidental (unintentional), initial encounter: Secondary | ICD-10-CM | POA: Insufficient documentation

## 2023-10-29 LAB — ACETAMINOPHEN LEVEL: Acetaminophen (Tylenol), Serum: <10 ug/mL — ABNORMAL LOW (ref 10–30)

## 2023-10-29 LAB — COMPREHENSIVE METABOLIC PANEL WITH GFR
ALT: 53 U/L — ABNORMAL HIGH (ref 0–44)
AST: 59 U/L — ABNORMAL HIGH (ref 15–41)
Albumin: 4.1 g/dL (ref 3.5–5.0)
Alkaline Phosphatase: 88 U/L (ref 38–126)
Anion gap: 8 (ref 5–15)
BUN: 13 mg/dL (ref 6–20)
CO2: 26 mmol/L (ref 22–32)
Calcium: 8.6 mg/dL — ABNORMAL LOW (ref 8.9–10.3)
Chloride: 103 mmol/L (ref 98–111)
Creatinine, Ser: 1.05 mg/dL (ref 0.61–1.24)
GFR, Estimated: >60 mL/min (ref >60)
Glucose, Bld: 209 mg/dL — ABNORMAL HIGH (ref 70–99)
Potassium: 4 mmol/L (ref 3.5–5.1)
Sodium: 137 mmol/L (ref 135–145)
Total Bilirubin: 0.6 mg/dL (ref 0.0–1.2)
Total Protein: 7.4 g/dL (ref 6.5–8.1)

## 2023-10-29 LAB — MAGNESIUM: Magnesium: 2.3 mg/dL (ref 1.7–2.4)

## 2023-10-29 LAB — CBC WITH DIFFERENTIAL/PLATELET
Abs Immature Granulocytes: 0.06 K/uL (ref 0.00–0.07)
Basophils Absolute: 0.1 K/uL (ref 0.0–0.1)
Basophils Relative: 1 %
Eosinophils Absolute: 0.2 K/uL (ref 0.0–0.5)
Eosinophils Relative: 3 %
HCT: 42.3 % (ref 39.0–52.0)
Hemoglobin: 14.5 g/dL (ref 13.0–17.0)
Immature Granulocytes: 1 %
Lymphocytes Relative: 30 %
Lymphs Abs: 1.9 K/uL (ref 0.7–4.0)
MCH: 30.3 pg (ref 26.0–34.0)
MCHC: 34.3 g/dL (ref 30.0–36.0)
MCV: 88.5 fL (ref 80.0–100.0)
Monocytes Absolute: 0.6 K/uL (ref 0.1–1.0)
Monocytes Relative: 9 %
Neutro Abs: 3.5 K/uL (ref 1.7–7.7)
Neutrophils Relative %: 56 %
Platelets: 293 K/uL (ref 150–400)
RBC: 4.78 MIL/uL (ref 4.22–5.81)
RDW: 13.5 % (ref 11.5–15.5)
WBC: 6.2 K/uL (ref 4.0–10.5)
nRBC: 0 % (ref 0.0–0.2)

## 2023-10-29 LAB — ETHANOL: Alcohol, Ethyl (B): <15 mg/dL (ref <15)

## 2023-10-29 LAB — SALICYLATE LEVEL: Salicylate Lvl: <7 mg/dL — ABNORMAL LOW (ref 7.0–30.0)

## 2023-10-29 MED ORDER — LACTATED RINGERS IV BOLUS
1000.0000 mL | Freq: Once | INTRAVENOUS | Status: AC
  Administered 2023-10-29: 1000 mL via INTRAVENOUS

## 2023-10-29 MED ORDER — ONDANSETRON HCL 4 MG/2ML IJ SOLN
4.0000 mg | Freq: Once | INTRAMUSCULAR | Status: AC
  Administered 2023-10-29: 4 mg via INTRAVENOUS
  Filled 2023-10-29: qty 2

## 2023-10-29 NOTE — ED Notes (Signed)
 Family at bedside. Pt placed on 2L for comfort measures.

## 2023-10-29 NOTE — ED Provider Notes (Signed)
 [facility] Provider Note    Event Date/Time   First MD Initiated Contact with Patient 10/29/23 1520     (approximate)   History   Drug Overdose   HPI  Dakota Oliver is a 33 y.o. male who presents to the ED for evaluation of Drug Overdose   I reviewed PCP visit from September.  Seen for draining abscess on his left gluteus  Patient presents to the ED from home via EMS after reportedly overdosing at home.  Family saw him normal and 5 minutes later found him with agonal respirations on the floor.  Responded to Narcan receiving a total of 6 mg, 2 mg IV, 2 IM and 2 intranasal.  Patient reports accidental overdose.  No IVDU.  No SI.  No preceding illnesses.   Physical Exam   Triage Vital Signs: ED Triage Vitals  Encounter Vitals Group     BP      Girls Systolic BP Percentile      Girls Diastolic BP Percentile      Boys Systolic BP Percentile      Boys Diastolic BP Percentile      Pulse      Resp      Temp      Temp src      SpO2      Weight      Height      Head Circumference      Peak Flow      Pain Score      Pain Loc      Pain Education      Exclude from Growth Chart     Most recent vital signs: Vitals:   10/29/23 2030 10/29/23 2032  BP: 119/71   Pulse: (!) 104 (!) 107  Resp: 17 17  Temp:    SpO2: 90% 100%    General: Awake, no distress.  Disoriented, nonfocal exam.  Asking about his family CV:  Good peripheral perfusion.  Resp:  Normal effort.  Abd:  No distention.  Soft MSK:  No deformity noted.  Neuro:  No focal deficits appreciated. Other:     ED Results / Procedures / Treatments   Labs (all labs ordered are listed, but only abnormal results are displayed) Labs Reviewed  COMPREHENSIVE METABOLIC PANEL WITH GFR - Abnormal; Notable for the following components:      Result Value   Glucose, Bld 209 (*)    Calcium 8.6 (*)    AST 59 (*)    ALT 53 (*)    All other components within normal limits  SALICYLATE  LEVEL - Abnormal; Notable for the following components:   Salicylate Lvl <7.0 (*)    All other components within normal limits  ACETAMINOPHEN  LEVEL - Abnormal; Notable for the following components:   Acetaminophen  (Tylenol ), Serum <10 (*)    All other components within normal limits  CBC WITH DIFFERENTIAL/PLATELET  MAGNESIUM  ETHANOL  URINALYSIS, ROUTINE W REFLEX MICROSCOPIC  URINE DRUG SCREEN, QUALITATIVE ([facility] ONLY)    EKG Sinus tachycardia with rate of 109 bpm.  Normal axis.  QTc 480.  No for signs of acute ischemia.  RADIOLOGY CXR interpreted by me without evidence of acute cardiopulmonary pathology.  Official radiology report(s): DG Chest Portable 1 View Result Date: 10/29/2023 CLINICAL DATA:  Opiate overdose EXAM: PORTABLE CHEST 1 VIEW COMPARISON:  Chest x-ray 07/26/2021 FINDINGS: The heart size and mediastinal contours are within normal limits. Both lungs are clear. The visualized skeletal structures are  unremarkable. IMPRESSION: No active disease. Electronically Signed   By: Greig Pique M.D.   On: 10/29/2023 16:58    PROCEDURES and INTERVENTIONS:  .1-3 Lead EKG Interpretation  Performed by: Claudene Rover, MD Authorized by: Claudene Rover, MD     Interpretation: normal     ECG rate:  98   ECG rate assessment: normal     Rhythm: sinus rhythm     Ectopy: none     Conduction: normal     Medications  ondansetron  (ZOFRAN ) injection 4 mg (4 mg Intravenous Given 10/29/23 1528)  lactated ringers  bolus 1,000 mL (0 mLs Intravenous Stopped 10/29/23 1900)     IMPRESSION / MDM / ASSESSMENT AND PLAN / ED COURSE  I reviewed the triage vital signs and the nursing notes.  Differential diagnosis includes, but is not limited to, polysubstance abuse, intentional overdose, accidental overdose, poisoning, metabolic encephalopathy  {Patient presents with symptoms of an acute illness or injury that is potentially life-threatening.  Patient presents from home after an overdose without  indication for repeat dosing of Narcan and suitable for outpatient management in the custody of family.  Nonfocal exam without signs of trauma.  No recurrence of somnolence to indicate repeat dosing of Narcan.  He is observed for greater than 5 hours.  Normal CBC, mild hyperglycemia without acidosis.  Negative ethanol level.  Clear CXR.  He refuses to provide urine as no abdominal pain, tenderness or urinary symptoms  Clinical Course as of 10/29/23 2100  Thu Oct 29, 2023  1629 reassessed [DS]  2006 Reassessed.  His father and wife are at the bedside.  They report that he seems to be at baseline.  Wife will stay with him tonight and they are comfortable with him going home.  They have already reached out to a rehab facility.  We discussed ED return precautions they are appreciative. [DS]    Clinical Course User Index [DS] Claudene Rover, MD     FINAL CLINICAL IMPRESSION(S) / ED DIAGNOSES   Final diagnoses:  Opiate overdose, accidental or unintentional, initial encounter [facility])     Rx / DC Orders   ED Discharge Orders     None        Note:  This document was prepared using Dragon voice recognition software and may include unintentional dictation errors.   Claudene Rover, MD 10/29/23 2101

## 2023-10-29 NOTE — ED Triage Notes (Signed)
 Pt BIB [facility] from home c/o possible overdose. EMS reports pt had agonal breathing with family after being alert 10 minutes prior. EMS gave 6mg  Narcan in total. 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 Presents to the ED alert but confused.  140/90 96 RA 100 HR 180 cbg

## 2023-10-29 NOTE — ED Notes (Signed)
 Pt given urinal to pee. Pt not able to
